# Patient Record
Sex: Female | Born: 1960 | Race: White | Hispanic: No | Marital: Single | Smoking: Never smoker
Health system: Southern US, Community
[De-identification: ages and names within clinical notes are randomized; demographics above are authoritative.]

## PROBLEM LIST (undated history)

## (undated) DIAGNOSIS — K8020 Calculus of gallbladder without cholecystitis without obstruction: Secondary | ICD-10-CM

## (undated) DIAGNOSIS — E079 Disorder of thyroid, unspecified: Secondary | ICD-10-CM

## (undated) DIAGNOSIS — T884XXA Failed or difficult intubation, initial encounter: Secondary | ICD-10-CM

## (undated) DIAGNOSIS — N6019 Diffuse cystic mastopathy of unspecified breast: Secondary | ICD-10-CM

## (undated) DIAGNOSIS — G43909 Migraine, unspecified, not intractable, without status migrainosus: Secondary | ICD-10-CM

## (undated) DIAGNOSIS — I10 Essential (primary) hypertension: Secondary | ICD-10-CM

## (undated) DIAGNOSIS — E669 Obesity, unspecified: Secondary | ICD-10-CM

## (undated) DIAGNOSIS — K760 Fatty (change of) liver, not elsewhere classified: Secondary | ICD-10-CM

## (undated) DIAGNOSIS — H9192 Unspecified hearing loss, left ear: Secondary | ICD-10-CM

## (undated) DIAGNOSIS — E559 Vitamin D deficiency, unspecified: Secondary | ICD-10-CM

## (undated) DIAGNOSIS — H8109 Meniere's disease, unspecified ear: Secondary | ICD-10-CM

## (undated) HISTORY — DX: Obesity, unspecified: E66.9

## (undated) HISTORY — DX: Migraine, unspecified, not intractable, without status migrainosus: G43.909

## (undated) HISTORY — DX: Calculus of gallbladder without cholecystitis without obstruction: K80.20

## (undated) HISTORY — DX: Vitamin D deficiency, unspecified: E55.9

## (undated) HISTORY — DX: Diffuse cystic mastopathy of unspecified breast: N60.19

## (undated) HISTORY — DX: Fatty (change of) liver, not elsewhere classified: K76.0

## (undated) HISTORY — PX: THYROIDECTOMY: SHX17

## (undated) HISTORY — PX: TONSILLECTOMY: SUR1361

## (undated) HISTORY — DX: Unspecified hearing loss, left ear: H91.92

---

## 1999-12-22 ENCOUNTER — Emergency Department (HOSPITAL_COMMUNITY): Admission: EM | Admit: 1999-12-22 | Discharge: 1999-12-22 | Payer: Self-pay | Admitting: Emergency Medicine

## 2003-06-08 ENCOUNTER — Other Ambulatory Visit: Admission: RE | Admit: 2003-06-08 | Discharge: 2003-06-08 | Payer: Self-pay | Admitting: Family Medicine

## 2004-07-03 ENCOUNTER — Other Ambulatory Visit: Admission: RE | Admit: 2004-07-03 | Discharge: 2004-07-03 | Payer: Self-pay | Admitting: Family Medicine

## 2005-07-04 ENCOUNTER — Other Ambulatory Visit: Admission: RE | Admit: 2005-07-04 | Discharge: 2005-07-04 | Payer: Self-pay | Admitting: Family Medicine

## 2005-08-08 ENCOUNTER — Other Ambulatory Visit: Admission: RE | Admit: 2005-08-08 | Discharge: 2005-08-08 | Payer: Self-pay | Admitting: Diagnostic Radiology

## 2005-11-01 ENCOUNTER — Ambulatory Visit: Payer: Self-pay | Admitting: Cardiology

## 2005-11-01 ENCOUNTER — Ambulatory Visit: Payer: Self-pay

## 2005-11-05 ENCOUNTER — Encounter (INDEPENDENT_AMBULATORY_CARE_PROVIDER_SITE_OTHER): Payer: Self-pay | Admitting: Specialist

## 2005-11-05 ENCOUNTER — Ambulatory Visit (HOSPITAL_COMMUNITY): Admission: RE | Admit: 2005-11-05 | Discharge: 2005-11-06 | Payer: Self-pay | Admitting: General Surgery

## 2005-12-13 ENCOUNTER — Ambulatory Visit: Payer: Self-pay | Admitting: Cardiology

## 2006-09-13 ENCOUNTER — Other Ambulatory Visit: Admission: RE | Admit: 2006-09-13 | Discharge: 2006-09-13 | Payer: Self-pay | Admitting: Family Medicine

## 2007-09-19 ENCOUNTER — Other Ambulatory Visit: Admission: RE | Admit: 2007-09-19 | Discharge: 2007-09-19 | Payer: Self-pay | Admitting: Family Medicine

## 2008-01-23 ENCOUNTER — Encounter: Admission: RE | Admit: 2008-01-23 | Discharge: 2008-01-23 | Payer: Self-pay | Admitting: Family Medicine

## 2008-07-17 ENCOUNTER — Emergency Department (HOSPITAL_BASED_OUTPATIENT_CLINIC_OR_DEPARTMENT_OTHER): Admission: EM | Admit: 2008-07-17 | Discharge: 2008-07-18 | Payer: Self-pay | Admitting: Emergency Medicine

## 2008-09-20 ENCOUNTER — Other Ambulatory Visit: Admission: RE | Admit: 2008-09-20 | Discharge: 2008-09-20 | Payer: Self-pay | Admitting: Family Medicine

## 2009-02-03 ENCOUNTER — Encounter: Admission: RE | Admit: 2009-02-03 | Discharge: 2009-02-03 | Payer: Self-pay | Admitting: Family Medicine

## 2009-02-15 ENCOUNTER — Encounter: Admission: RE | Admit: 2009-02-15 | Discharge: 2009-02-15 | Payer: Self-pay | Admitting: Family Medicine

## 2010-06-14 ENCOUNTER — Other Ambulatory Visit: Payer: Self-pay | Admitting: Family Medicine

## 2010-06-14 DIAGNOSIS — Z1231 Encounter for screening mammogram for malignant neoplasm of breast: Secondary | ICD-10-CM

## 2010-06-27 ENCOUNTER — Ambulatory Visit
Admission: RE | Admit: 2010-06-27 | Discharge: 2010-06-27 | Disposition: A | Discharge status: Home / Self Care | Payer: BC Managed Care – PPO | Source: Ambulatory Visit | Attending: Family Medicine | Admitting: Family Medicine

## 2010-06-27 DIAGNOSIS — Z1231 Encounter for screening mammogram for malignant neoplasm of breast: Secondary | ICD-10-CM

## 2010-08-11 NOTE — Assessment & Plan Note (Signed)
[facility]                              CARDIOLOGY OFFICE NOTE   AMAAL, DIMARTINO                      MRN:          [id number]  DATE:12/13/2005                            DOB:          02-Oct-1960    PRIMARY CARE PHYSICIAN:  Dr. Laurann Montana   SURGEON:  Dr. Chevis Pretty III.   REASON FOR VISIT:  Post operative followup.   HISTORY OF PRESENT ILLNESS:  I saw Ms. Brandy Harris back in August for a preop  evaluation prior to an elective thyroidectomy.  Her history is outlined in  my previous note.  I referred her for a resting echocardiogram which  demonstrated vigorous left ventricular systolic function without regional  wall motion abnormalities.  She was noted to be tachycardiac at that time,  possibly related to her thyroid status, although her actual TSH and T4  levels were not particularly abnormal from laboratory data back in June.  We  placed her on a perioperative beta blocker, as outlined below, and she  tolerated this well.  She underwent surgery without cardiovascular  complication and comes in today stating that she feels well.  Her heart rate  has decreased to just under 100 beats per minute by my exam today.  She is  not reporting any chest pain or dyspnea.   ALLERGIES:  No known drug allergies.   PRESENT MEDICATIONS:  1. Allegra D p.r.n.  2. Rhinocort.  3. Singulair.  4. Astelin.  5. Aldean Ast.  6. Hydrochlorothiazide/triamterene 25/37.5 mg p.o. daily.  7. Synthroid 0.1 mg p.o. daily.  8. Toprol XL 25 mg p.o. daily.   REVIEW OF SYSTEMS:  As per history of present illness.   EXAMINATION:  Blood pressure 139/90.  Heart rate 98 and regular.  Weight is  232 pounds.  Patient is comfortable and in no acute distress.  LUNGS:  Clear without labored breathing.  CARDIAC:  Reveals a regular rate and rhythm without pericardial rub, S3  gallop or loud murmur.  EXTREMITIES:  Exhibit no pitting edema.   RECOMMENDATIONS:  1. Sinus  tachycardia, improved following thyroidectomy and also on beta      blocker therapy.  Left ventricular systolic function was vigorous by      preoperative echocardiogram, and she is not reporting any chest pain or      limiting dyspnea, stating that she feels quite well.  At this point I      have asked her to continue her present Toprol XL for the next week and      then decrease the dose to 12.5 mg daily, subsequently discontinuing the      medication after it runs out.  I have then asked for her to follow up      with her primary care physician for further assessment.  At this point,      I do not anticipate any additional cardiac studies.  2. Ms. Mckiver also states that she has followup with Dr. Carolynne Edouard and I will      arrange to have records sent to his office as well.  Jonelle Sidle, MD    SGM/MedQ  DD:  12/13/2005  DT:  12/14/2005  Job #:  [id number]   cc:   Stacie Acres. Cliffton Asters, M.D.  Ollen Gross. Vernell Morgans, M.D.

## 2010-08-11 NOTE — Op Note (Signed)
NAMEKATORI, WIRSING             ACCOUNT NO.:  [id number]   MEDICAL RECORD NO.:  [id number]          PATIENT TYPE:  OIB   LOCATION:  5727                         FACILITY:  [facility]   PHYSICIAN:  Ollen Gross. Vernell Morgans, M.D. DATE OF BIRTH:  [birth year]   DATE OF PROCEDURE:  11/07/2005  DATE OF DISCHARGE:  11/06/2005                                 OPERATIVE REPORT   PREOPERATIVE DIAGNOSIS:  Bilateral thyroid nodules.   POSTOPERATIVE DIAGNOSIS:  Bilateral thyroid nodules.   PROCEDURE:  Total thyroidectomy.   SURGEON:  Ollen Gross. Carolynne Edouard, M.D.   ASSISTANT:  Currie Paris, M.D.   ANESTHESIA:  General endotracheal.   PROCEDURE:  After informed consent was obtained, the patient was brought to  the operating room and placed in supine position on the operating room  table.  After adequate induction of general anesthesia, a roll was placed  behind the patient's shoulders to extend the neck slightly.  The patient's  neck was then prepped with Betadine and draped in usual sterile manner.  A  low transverse collar type incision was made with a 15 blade knife.  This  incision was carried down through the skin and subcutaneous tissue and  platysma sharply with the electrocautery.  The platysma layer was then  grasped with two Allis clamps, both superiorly and inferiorly and  subplatysmal flaps were made.  This was done by a combination of sharp  dissection with electrocautery and blunt finger dissection.  Once this was  accomplished, a Adult nurse was deployed.  The dissection was then  carried down along the midline until the trachea and isthmus of the thyroid  was identified.  Attention was first turned to the left side which had and  indeterminate follicular lesion.  The strap muscles were retracted laterally  with Army-Navy retractor.  Blunt dissection with a Kitner was used to  separate the muscular layers from the anterior surface of the thyroid lobe.  The thyroid lobe was then  rotated medially and anteriorly.  As this was  done, some of the loose areolar tissue was divided sharply with the  electrocautery.  Several small vessels were identified on the lateral  surface of the thyroid gland.  These were controlled with small vascular  clips and divided with Metzenbaum scissors.  Once the gland was able to be  rolled more medially, the parathyroid glands and recurrent laryngeal nerve  were identified and care was taken to keep these structures away from the  dissection.  The dissection was carried out on the surface of the thyroid  gland.  The superior pole of the thyroid gland was then dissected bluntly  circumferentially with a right-angle clamp.  The superior pole was  controlled with 2-0 silk ties and a medium vascular clip and then divided  between the two.  Once the thyroid had been rolled more medial and away from  the recurrent laryngeal nerve and parathyroid glands, the rest of the  thyroid gland was dissected off the trachea by sharp dissection with  electrocautery.  The isthmus was clamped with hemostats and divided with  Metzenbaum scissors.  The left lobe was then sent to pathology for frozen  section.  Ray-Tec gauze was then packed in the operative bed.  The isthmus  was controlled with two 3-0 Vicryl stitches.  At this point, the right lobe  of the thyroid was palpated and had an obvious nodular feeling portion to  it.  Because of this abnormality, it was decided to remove the right lobe as  well.  Again, the strap muscles were retracted laterally with an Army-Navy  retractor.  Finger dissection and blunt dissection with a Kitner were used  to separate the muscles from the anterior surface of the thyroid gland.  The  right thyroid lobe was then mobilized medially and anteriorly.  The loose  areolar tissue was divided sharply with the electrocautery.  Several small  vessels at the lateral edge of the thyroid were controlled with small  vascular clips  and divided with Metzenbaum scissors.  This allowed the  thyroid lobe to be rolled more medially and anteriorly.  The area  parathyroid glands and recurrent laryngeal nerve were also identified and  care was taken to keep the dissection away from these structures.  Once the  thyroid had been bluntly dissected away from those structures, the thyroid  was then separated from the surface of the trachea by sharp dissection with  the electrocautery.  The right lobe was then removed and sent to pathology  for further evaluation.  The operative bed on both sides of the neck were  then inspected and were found to be hemostatic.  The area was irrigated with  copious amounts of saline.  Small postage stamps of Surgicel were placed at  both operative beds.  The strap muscles were then reapproximated along the  midline with interrupted 3-0 Vicryl figure-of-eight stitches.  The platysma  was reapproximated with interrupted 3-0 Vicryl stitches and the skin was  closed with a running 4-0 Monocryl subcuticular stitch.  Benzoin, Steri-  Strips and sterile dressings were applied.  The patient tolerated the  procedure well. At the end of the case, all sponge, needle and instrument  counts were correct.  The patient was then awakened and taken to the  recovery room in stable condition.      Ollen Gross. Vernell Morgans, M.D.  Electronically Signed     PST/MEDQ  D:  11/07/2005  T:  11/07/2005  Job:  [id number]

## 2010-08-11 NOTE — Assessment & Plan Note (Signed)
[facility]                              CARDIOLOGY OFFICE NOTE   YENG, FRANKIE                      MRN:          [id number]  DATE:11/01/2005                            DOB:          01/04/1961    REFERRING PHYSICIAN:  Ollen Gross. Vernell Morgans, MD   REASON FOR CONSULTATION:  Preoperative evaluation.   HISTORY OF PRESENT ILLNESS:  Brandy Harris is referred today for a  preoperative assessment, having had a recent electrocardiogram that was  interpreted as abnormal.  My understanding is that she has been diagnosed  with thyroid nodules and there is concern for potential carcinoma.  She is  scheduled for a thyroidectomy on Monday.  She reports no significant history  of type 2 diabetes mellitus, hypertension, hyperlipidemia or cardiovascular  disease.  She also denies any specific history of cardiomyopathy or  dysrhythmias.  She had a preoperative electrocardiogram obtained on August  7, which was interpreted as reflecting a possible old inferior wall  infarction and also showed sinus tachycardia at 125 beats per minute.  Repeat tracing today shows a Q-wave in lead 3 only and decreased R-wave  progression, but no clear evidence of infarct.  It is certainly possible  that the isolated Q in lead 3 is a normal variant.  Ms. Talwar denies any  problems with exertional chest pain, dyspnea on exertion, orthopnea, PND or  palpitations.  I asked her about her rapid heart rate and she says that she  does not sense a feeling of rapid heart rate, only that she is somewhat  anxious about her visit today.  In going back through her records, I see her  heart rate on evaluation by Dr. Carolynne Edouard on June 5, was 98 beats per minute.  Thyroid studies from June were fairly normal, showing a TSH of 1.095 and a  free T4 of 1.2.  More recent laboratory data shows a normal white blood cell  count of 9.7, normal hemoglobin of 15, normal potassium of 4.1, normal  sodium of 1.38 and  normal BUN and creatinine of 11 and 0.8, respectively.   I reviewed the electrocardiograms with the patient today and discussed  additional evaluation with her.   ALLERGIES:  No known drug allergies.   PRESENT MEDICATIONS INCLUDE:  Allegra D, Rhinocort, Singulair, Astelin,  Lutera, triamterene hydrochlorothiazide, and meclizine.   PAST MEDICAL HISTORY:  As outlined above.  There is additional history of  Meniere's disease.  She has had tonsillectomy at age 50 and previously some  type of ear surgery.   SOCIAL HISTORY:  The patient is single and has no children.  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  She states that she does  not exercise regularly, but with her activities of daily living, including  housework and yard work, she has no limiting symptoms.  She denies any  tobacco or illicit substance use.  She drinks alcohol only occasionally.  She does not drink caffeinated beverages.   REVIEW OF SYSTEMS:  As outlined in the History of Present Illness.  She has  had no fevers or chills, cough, hemoptysis, melena, hematochezia, peripheral  edema, claudication, abdominal pain.   FAMILY HISTORY:  Significant for premature cardiovascular disease.  The  patient states that her father had a myocardial infarction at age 3.   EXAMINATION:  VITAL SIGNS:  Weight is 227 pounds, blood pressure 126/82,  heart rate 122.  GENERAL:  The patient is comfortable, denying any active symptoms.  HEENT:  No exophthalmos noted.  Oropharynx is clear.  NECK:  The neck is supple.  There is thyroid fullness noted bilaterally.  No  tenderness to palpation.  No elevated jugular venous pressure or loud  bruits.  LUNGS:  Clear without labored breathing.  CARDIAC EXAM:  Reveals a regular rate and rhythm without pericardial rub or  S3 gallop.  There is no loud murmur beyond a probable flow murmur, heard at   the base.  ABDOMEN:  Soft with no hepatomegaly or bruits.  EXTREMITIES:  Exhibit no significant pitting edema.  Pulses are 2+.   IMPRESSION AND RECOMMENDATIONS:  1. Sinus tachycardia noted in the setting of apparent thyroid disease.      Interestingly, her TSH and T4 were within normal limits, back in June.      She does not report any sense of palpitations and has no limiting chest      pain or dyspnea.  The isolated Q in lead 3 is most likely a normal      variant and not reflective of an infarct.  She does have poor R-wave      progression anteriorly, although this may also be due to lead      placement.  I have recommended that she begin taking Toprol XL at 25 mg      daily, particularly in the perioperative setting, and we will proceed      with a 2D echocardiogram to define her cardiac structure and function.      If she has a normal ejection fraction with no major valvular disease, I      would anticipate that she should be able to proceed with her planned      thyroidectomy on Monday.  We can then see her back postoperatively in      followup.  Otherwise, if any significant abnormalities are uncovered,      we will evaluate her prior to surgery.  2. Further plans to follow.                                Jonelle Sidle, MD    SGM/MedQ  DD:  11/01/2005  DT:  11/01/2005  Job #:  [id number]   cc:   Ollen Gross. Vernell Morgans, MD  Stacie Acres Cliffton Asters, MD

## 2011-11-07 ENCOUNTER — Encounter (HOSPITAL_BASED_OUTPATIENT_CLINIC_OR_DEPARTMENT_OTHER): Payer: Self-pay | Admitting: Emergency Medicine

## 2011-11-07 ENCOUNTER — Emergency Department (HOSPITAL_BASED_OUTPATIENT_CLINIC_OR_DEPARTMENT_OTHER)
Admission: EM | Admit: 2011-11-07 | Discharge: 2011-11-08 | Disposition: A | Discharge status: Home / Self Care | Payer: BC Managed Care – PPO | Attending: Emergency Medicine | Admitting: Emergency Medicine

## 2011-11-07 DIAGNOSIS — I10 Essential (primary) hypertension: Secondary | ICD-10-CM | POA: Insufficient documentation

## 2011-11-07 DIAGNOSIS — R109 Unspecified abdominal pain: Secondary | ICD-10-CM | POA: Insufficient documentation

## 2011-11-07 DIAGNOSIS — H8109 Meniere's disease, unspecified ear: Secondary | ICD-10-CM | POA: Insufficient documentation

## 2011-11-07 DIAGNOSIS — K2970 Gastritis, unspecified, without bleeding: Secondary | ICD-10-CM

## 2011-11-07 HISTORY — DX: Meniere's disease, unspecified ear: H81.09

## 2011-11-07 HISTORY — DX: Essential (primary) hypertension: I10

## 2011-11-07 LAB — URINALYSIS, ROUTINE W REFLEX MICROSCOPIC
Nitrite: NEGATIVE
Protein, ur: NEGATIVE mg/dL
Urobilinogen, UA: 0.2 mg/dL (ref 0.0–1.0)

## 2011-11-07 LAB — URINE MICROSCOPIC-ADD ON

## 2011-11-07 MED ORDER — GI COCKTAIL ~~LOC~~
30.0000 mL | Freq: Once | ORAL | Status: AC
  Administered 2011-11-07: 30 mL via ORAL
  Filled 2011-11-07: qty 30

## 2011-11-07 NOTE — ED Notes (Addendum)
Pt thought she was getting heartburn and took a Prilosec but got no relief.  5/10 bil upper abd pain, nontender to touch.  Also low back pain.  Skin is warm and moist.  Pt was not able to obtain urine specimen in triage.

## 2011-11-07 NOTE — ED Provider Notes (Signed)
History     CSN: [id number]  Arrival date & time 11/07/11  2124   First MD Initiated Contact with Patient 11/07/11 2318      Chief Complaint  Patient presents with  . Abdominal Pain    (Consider location/radiation/quality/duration/timing/severity/associated sxs/prior treatment) HPI This is a 51 year old white female with about a 4 hour history of epigastric pain. She describes the pain is like indigestion or heartburn, similar to previous episodes of heartburn and more intense. It is associated with nausea and low back pain, which is common when she has episodes of heartburn. She's not had any diarrhea or vomiting. She denies fever or chills. The pain is somewhat improved by lying on her side. It is worse lying on her back. She took Prilosec without relief. There is no change in pain with palpation.  Past Medical History  Diagnosis Date  . Meniere disease   . Hypertension     Past Surgical History  Procedure Date  . Thyroidectomy   . Tonsillectomy     No family history on file.  History  Substance Use Topics  . Smoking status: Not on file  . Smokeless tobacco: Not on file  . Alcohol Use: Yes     occasionally    OB History    Grav Para Term Preterm Abortions TAB SAB Ect Mult Living                  Review of Systems  All other systems reviewed and are negative.    Allergies  Review of patient's allergies indicates no known allergies.  Home Medications   Current Outpatient Rx  Name Route Sig Dispense Refill  . AZELASTINE HCL 137 MCG/SPRAY NA SOLN Nasal Place 1 spray into the nose 2 (two) times daily. Use in each nostril as directed    . DESLORATADINE-PSEUDOEPHED ER 2.5-120 MG PO TB12 Oral Take 1 tablet by mouth 2 (two) times daily.    Marland Kitchen LEVOTHYROXINE SODIUM 150 MCG PO TABS Oral Take 150 mcg by mouth daily.    Marland Kitchen LISINOPRIL PO Oral Take 1 tablet by mouth daily. Patient doesn't know the milligrams of this medication.    . MOMETASONE FUROATE 50 MCG/ACT NA SUSP  Nasal Place 2 sprays into the nose daily.    Marland Kitchen OMEPRAZOLE 10 MG PO CPDR Oral Take 10 mg by mouth daily.    Marland Kitchen VITAMIN D (ERGOCALCIFEROL) 50000 UNITS PO CAPS Oral Take 50,000 Units by mouth every 7 (seven) days.      BP 136/82  Pulse 90  Temp 97.5 F (36.4 C) (Oral)  Resp 20  Ht 5' 7.5" (1.715 m)  Wt 274 lb (124.286 kg)  BMI 42.28 kg/m2  SpO2 100%  LMP 10/07/2011  Physical Exam General: Well-developed, well-nourished female in no acute distress; appearance consistent with age of record HENT: normocephalic, atraumatic Eyes: pupils equal round and reactive to light; extraocular muscles intact Neck: supple Heart: regular rate and rhythm; no murmurs, rubs or gallops Lungs: clear to auscultation bilaterally Abdomen: soft; nondistended; nontender; no masses or hepatosplenomegaly; bowel sounds present Extremities: No deformity; full range of motion; pulses normal Neurologic: Awake, alert and oriented; motor function intact in all extremities and symmetric; no facial droop Skin: Warm and dry Psychiatric: Normal mood and affect    ED Course  Procedures (including critical care time)     MDM   Nursing notes and vitals signs, including pulse oximetry, reviewed.  Summary of this visit's results, reviewed by myself:  Labs:  Results for  orders placed during the hospital encounter of 11/07/11  URINALYSIS, ROUTINE W REFLEX MICROSCOPIC      Component Value Range   Color, Urine YELLOW  YELLOW   APPearance CLOUDY (*) CLEAR   Specific Gravity, Urine 1.027  1.005 - 1.030   pH 5.5  5.0 - 8.0   Glucose, UA NEGATIVE  NEGATIVE mg/dL   Hgb urine dipstick SMALL (*) NEGATIVE   Bilirubin Urine NEGATIVE  NEGATIVE   Ketones, ur NEGATIVE  NEGATIVE mg/dL   Protein, ur NEGATIVE  NEGATIVE mg/dL   Urobilinogen, UA 0.2  0.0 - 1.0 mg/dL   Nitrite NEGATIVE  NEGATIVE   Leukocytes, UA NEGATIVE  NEGATIVE  URINE MICROSCOPIC-ADD ON      Component Value Range   Squamous Epithelial / LPF FEW (*) RARE    WBC, UA 3-6  <3 WBC/hpf   RBC / HPF 3-6  <3 RBC/hpf   Bacteria, UA MANY (*) RARE    Imaging Studies: No results found.  EKG Interpretation:  Date & Time: 11/07/2011 9:58 PM  Rate: 83  Rhythm: normal sinus rhythm  QRS Axis: normal  Intervals: normal  ST/T Wave abnormalities: normal  Conduction Disutrbances:none  Narrative Interpretation:   Old EKG Reviewed: Previously sinus tachycardia  12:06 AM Significant relief with GI cocktail. Patient was advised to take Prilosec daily not just as needed. We will also give her Carafate. She was advised to return for worsening or changing symptoms. There is no tenderness on exam making biliary colic or pancreatitis unlikely.           Hanley Seamen, MD 11/08/11 [phone]

## 2011-11-07 NOTE — ED Notes (Signed)
MD at bedside. 

## 2011-11-08 MED ORDER — SUCRALFATE 1 G PO TABS: ORAL_TABLET | ORAL | Status: DC

## 2011-11-08 MED ORDER — SUCRALFATE 1 G PO TABS
1.0000 g | ORAL_TABLET | Freq: Once | ORAL | Status: AC
  Administered 2011-11-08: 1 g via ORAL
  Filled 2011-11-08: qty 1

## 2011-11-08 MED ORDER — OMEPRAZOLE 20 MG PO CPDR: 20.0000 mg | DELAYED_RELEASE_CAPSULE | Freq: Every day | ORAL | Status: DC

## 2011-12-31 ENCOUNTER — Other Ambulatory Visit: Payer: Self-pay | Admitting: Oncology

## 2012-02-01 ENCOUNTER — Other Ambulatory Visit: Payer: Self-pay | Admitting: Family Medicine

## 2012-02-01 DIAGNOSIS — Z1231 Encounter for screening mammogram for malignant neoplasm of breast: Secondary | ICD-10-CM

## 2012-02-04 ENCOUNTER — Ambulatory Visit
Admission: RE | Admit: 2012-02-04 | Discharge: 2012-02-04 | Disposition: A | Discharge status: Home / Self Care | Payer: BC Managed Care – PPO | Source: Ambulatory Visit | Attending: Family Medicine | Admitting: Family Medicine

## 2012-02-04 DIAGNOSIS — Z1231 Encounter for screening mammogram for malignant neoplasm of breast: Secondary | ICD-10-CM

## 2012-04-24 ENCOUNTER — Other Ambulatory Visit: Payer: Self-pay | Admitting: *Deleted

## 2012-07-14 ENCOUNTER — Other Ambulatory Visit (HOSPITAL_COMMUNITY)
Admission: RE | Admit: 2012-07-14 | Discharge: 2012-07-14 | Disposition: A | Discharge status: Home / Self Care | Payer: BC Managed Care – PPO | Source: Ambulatory Visit | Attending: Family Medicine | Admitting: Family Medicine

## 2012-07-14 ENCOUNTER — Other Ambulatory Visit: Payer: Self-pay | Admitting: Family Medicine

## 2012-07-14 DIAGNOSIS — Z0000 Encounter for general adult medical examination without abnormal findings: Secondary | ICD-10-CM | POA: Insufficient documentation

## 2013-01-27 ENCOUNTER — Other Ambulatory Visit: Payer: Self-pay

## 2013-01-27 DIAGNOSIS — Z1231 Encounter for screening mammogram for malignant neoplasm of breast: Secondary | ICD-10-CM

## 2013-03-16 ENCOUNTER — Ambulatory Visit
Admission: RE | Admit: 2013-03-16 | Discharge: 2013-03-16 | Disposition: A | Discharge status: Home / Self Care | Payer: BC Managed Care – PPO | Source: Ambulatory Visit

## 2013-03-16 DIAGNOSIS — Z1231 Encounter for screening mammogram for malignant neoplasm of breast: Secondary | ICD-10-CM

## 2013-07-05 ENCOUNTER — Encounter (HOSPITAL_BASED_OUTPATIENT_CLINIC_OR_DEPARTMENT_OTHER): Payer: Self-pay | Admitting: Emergency Medicine

## 2013-07-05 ENCOUNTER — Observation Stay (HOSPITAL_BASED_OUTPATIENT_CLINIC_OR_DEPARTMENT_OTHER)
Admission: EM | Admit: 2013-07-05 | Discharge: 2013-07-07 | Disposition: A | Discharge status: Home / Self Care | Payer: BC Managed Care – PPO | Attending: Surgery | Admitting: Surgery

## 2013-07-05 DIAGNOSIS — R112 Nausea with vomiting, unspecified: Secondary | ICD-10-CM | POA: Insufficient documentation

## 2013-07-05 DIAGNOSIS — Z79899 Other long term (current) drug therapy: Secondary | ICD-10-CM | POA: Insufficient documentation

## 2013-07-05 DIAGNOSIS — E890 Postprocedural hypothyroidism: Secondary | ICD-10-CM | POA: Insufficient documentation

## 2013-07-05 DIAGNOSIS — E279 Disorder of adrenal gland, unspecified: Secondary | ICD-10-CM | POA: Insufficient documentation

## 2013-07-05 DIAGNOSIS — D72829 Elevated white blood cell count, unspecified: Secondary | ICD-10-CM | POA: Insufficient documentation

## 2013-07-05 DIAGNOSIS — H8109 Meniere's disease, unspecified ear: Secondary | ICD-10-CM | POA: Insufficient documentation

## 2013-07-05 DIAGNOSIS — M545 Low back pain, unspecified: Secondary | ICD-10-CM | POA: Insufficient documentation

## 2013-07-05 DIAGNOSIS — R1013 Epigastric pain: Secondary | ICD-10-CM | POA: Diagnosis present

## 2013-07-05 DIAGNOSIS — E669 Obesity, unspecified: Secondary | ICD-10-CM | POA: Insufficient documentation

## 2013-07-05 DIAGNOSIS — K37 Unspecified appendicitis: Secondary | ICD-10-CM

## 2013-07-05 DIAGNOSIS — K219 Gastro-esophageal reflux disease without esophagitis: Secondary | ICD-10-CM | POA: Insufficient documentation

## 2013-07-05 DIAGNOSIS — N83209 Unspecified ovarian cyst, unspecified side: Secondary | ICD-10-CM | POA: Insufficient documentation

## 2013-07-05 DIAGNOSIS — I10 Essential (primary) hypertension: Secondary | ICD-10-CM | POA: Diagnosis present

## 2013-07-05 DIAGNOSIS — R109 Unspecified abdominal pain: Principal | ICD-10-CM | POA: Insufficient documentation

## 2013-07-05 HISTORY — DX: Failed or difficult intubation, initial encounter: T88.4XXA

## 2013-07-05 MED ORDER — ONDANSETRON 8 MG PO TBDP
8.0000 mg | ORAL_TABLET | Freq: Once | ORAL | Status: AC
  Administered 2013-07-05: 8 mg via ORAL
  Filled 2013-07-05: qty 1

## 2013-07-05 NOTE — ED Notes (Signed)
Pt reports onset of abd pain at 17:30 today admits to nausea but denies emesis or diarrhea. Also denies event or injury to abd

## 2013-07-06 ENCOUNTER — Encounter (HOSPITAL_BASED_OUTPATIENT_CLINIC_OR_DEPARTMENT_OTHER): Payer: Self-pay | Admitting: Emergency Medicine

## 2013-07-06 ENCOUNTER — Emergency Department (HOSPITAL_BASED_OUTPATIENT_CLINIC_OR_DEPARTMENT_OTHER): Payer: BC Managed Care – PPO

## 2013-07-06 ENCOUNTER — Encounter (HOSPITAL_COMMUNITY): Payer: BC Managed Care – PPO | Admitting: Registered Nurse

## 2013-07-06 ENCOUNTER — Observation Stay (HOSPITAL_COMMUNITY): Payer: BC Managed Care – PPO | Admitting: Registered Nurse

## 2013-07-06 ENCOUNTER — Encounter (HOSPITAL_COMMUNITY): Admission: EM | Disposition: A | Discharge status: Home / Self Care | Payer: Self-pay | Source: Home / Self Care

## 2013-07-06 DIAGNOSIS — R1013 Epigastric pain: Secondary | ICD-10-CM

## 2013-07-06 DIAGNOSIS — R933 Abnormal findings on diagnostic imaging of other parts of digestive tract: Secondary | ICD-10-CM

## 2013-07-06 DIAGNOSIS — K37 Unspecified appendicitis: Secondary | ICD-10-CM | POA: Diagnosis present

## 2013-07-06 DIAGNOSIS — H8109 Meniere's disease, unspecified ear: Secondary | ICD-10-CM | POA: Diagnosis present

## 2013-07-06 DIAGNOSIS — I10 Essential (primary) hypertension: Secondary | ICD-10-CM | POA: Diagnosis present

## 2013-07-06 HISTORY — PX: LAPAROSCOPIC APPENDECTOMY: SHX408

## 2013-07-06 LAB — URINALYSIS, ROUTINE W REFLEX MICROSCOPIC
Bilirubin Urine: NEGATIVE
Glucose, UA: NEGATIVE mg/dL
Ketones, ur: 15 mg/dL — AB
LEUKOCYTES UA: NEGATIVE
Nitrite: NEGATIVE
PROTEIN: NEGATIVE mg/dL
SPECIFIC GRAVITY, URINE: 1.022 (ref 1.005–1.030)
UROBILINOGEN UA: 0.2 mg/dL (ref 0.0–1.0)
pH: 5.5 (ref 5.0–8.0)

## 2013-07-06 LAB — COMPREHENSIVE METABOLIC PANEL
ALBUMIN: 3.9 g/dL (ref 3.5–5.2)
ALK PHOS: 92 U/L (ref 39–117)
ALT: 37 U/L — ABNORMAL HIGH (ref 0–35)
AST: 27 U/L (ref 0–37)
BUN: 13 mg/dL (ref 6–23)
CO2: 22 mEq/L (ref 19–32)
Calcium: 9.3 mg/dL (ref 8.4–10.5)
Chloride: 104 mEq/L (ref 96–112)
Creatinine, Ser: 0.8 mg/dL (ref 0.50–1.10)
GFR calc Af Amer: >90 mL/min (ref >90)
GFR calc non Af Amer: 83 mL/min — ABNORMAL LOW (ref >90)
Glucose, Bld: 135 mg/dL — ABNORMAL HIGH (ref 70–99)
POTASSIUM: 4 meq/L (ref 3.7–5.3)
SODIUM: 140 meq/L (ref 137–147)
TOTAL PROTEIN: 7.4 g/dL (ref 6.0–8.3)
Total Bilirubin: 0.4 mg/dL (ref 0.3–1.2)

## 2013-07-06 LAB — CBC WITH DIFFERENTIAL/PLATELET
BASOS ABS: 0 10*3/uL (ref 0.0–0.1)
Basophils Relative: 0 % (ref 0–1)
EOS ABS: 0 10*3/uL (ref 0.0–0.7)
Eosinophils Relative: 0 % (ref 0–5)
HCT: 41.2 % (ref 36.0–46.0)
Hemoglobin: 14 g/dL (ref 12.0–15.0)
Lymphocytes Relative: 6 % — ABNORMAL LOW (ref 12–46)
Lymphs Abs: 1 10*3/uL (ref 0.7–4.0)
MCH: 27.4 pg (ref 26.0–34.0)
MCHC: 34 g/dL (ref 30.0–36.0)
MCV: 80.6 fL (ref 78.0–100.0)
Monocytes Absolute: 0.4 10*3/uL (ref 0.1–1.0)
Monocytes Relative: 2 % — ABNORMAL LOW (ref 3–12)
NEUTROS PCT: 91 % — AB (ref 43–77)
Neutro Abs: 15.4 10*3/uL — ABNORMAL HIGH (ref 1.7–7.7)
PLATELETS: 333 10*3/uL (ref 150–400)
RBC: 5.11 MIL/uL (ref 3.87–5.11)
RDW: 15.7 % — AB (ref 11.5–15.5)
WBC: 16.9 10*3/uL — AB (ref 4.0–10.5)

## 2013-07-06 LAB — SURGICAL PCR SCREEN
MRSA, PCR: NEGATIVE
STAPHYLOCOCCUS AUREUS: POSITIVE — AB

## 2013-07-06 LAB — URINE MICROSCOPIC-ADD ON

## 2013-07-06 LAB — CBC
HCT: 39.2 % (ref 36.0–46.0)
HEMOGLOBIN: 13.2 g/dL (ref 12.0–15.0)
MCH: 26.9 pg (ref 26.0–34.0)
MCHC: 33.7 g/dL (ref 30.0–36.0)
MCV: 80 fL (ref 78.0–100.0)
Platelets: 276 10*3/uL (ref 150–400)
RBC: 4.9 MIL/uL (ref 3.87–5.11)
RDW: 15.3 % (ref 11.5–15.5)
WBC: 17.7 10*3/uL — AB (ref 4.0–10.5)

## 2013-07-06 LAB — LIPASE, BLOOD: Lipase: 24 U/L (ref 11–59)

## 2013-07-06 SURGERY — APPENDECTOMY, LAPAROSCOPIC
Anesthesia: General | Site: Abdomen

## 2013-07-06 MED ORDER — PROPOFOL 10 MG/ML IV BOLUS
INTRAVENOUS | Status: AC
  Filled 2013-07-06: qty 40

## 2013-07-06 MED ORDER — ROCURONIUM BROMIDE 100 MG/10ML IV SOLN
INTRAVENOUS | Status: AC
  Filled 2013-07-06: qty 1

## 2013-07-06 MED ORDER — ONDANSETRON HCL 4 MG/2ML IJ SOLN
4.0000 mg | Freq: Four times a day (QID) | INTRAMUSCULAR | Status: DC | PRN
  Administered 2013-07-06 (×2): 4 mg via INTRAVENOUS

## 2013-07-06 MED ORDER — ACETAMINOPHEN 10 MG/ML IV SOLN
1000.0000 mg | Freq: Once | INTRAVENOUS | Status: DC
  Filled 2013-07-06 (×2): qty 100

## 2013-07-06 MED ORDER — LIDOCAINE HCL (CARDIAC) 20 MG/ML IV SOLN
INTRAVENOUS | Status: AC
  Filled 2013-07-06: qty 5

## 2013-07-06 MED ORDER — MORPHINE SULFATE 2 MG/ML IJ SOLN: 1.0000 mg | INTRAMUSCULAR | Status: DC | PRN

## 2013-07-06 MED ORDER — SUFENTANIL CITRATE 50 MCG/ML IV SOLN
INTRAVENOUS | Status: AC
  Filled 2013-07-06: qty 1

## 2013-07-06 MED ORDER — BUPIVACAINE-EPINEPHRINE PF 0.25-1:200000 % IJ SOLN
INTRAMUSCULAR | Status: AC
  Filled 2013-07-06: qty 30

## 2013-07-06 MED ORDER — LACTATED RINGERS IV SOLN
INTRAVENOUS | Status: DC | PRN
  Administered 2013-07-06 (×2): via INTRAVENOUS

## 2013-07-06 MED ORDER — PHENYLEPHRINE HCL 10 MG/ML IJ SOLN
INTRAMUSCULAR | Status: DC | PRN
  Administered 2013-07-06: 80 ug via INTRAVENOUS

## 2013-07-06 MED ORDER — DICYCLOMINE HCL 10 MG/ML IM SOLN
20.0000 mg | Freq: Once | INTRAMUSCULAR | Status: AC
  Administered 2013-07-06: 20 mg via INTRAMUSCULAR
  Filled 2013-07-06: qty 2

## 2013-07-06 MED ORDER — DEXAMETHASONE SODIUM PHOSPHATE 10 MG/ML IJ SOLN
INTRAMUSCULAR | Status: AC
  Filled 2013-07-06: qty 1

## 2013-07-06 MED ORDER — HYDROMORPHONE HCL PF 1 MG/ML IJ SOLN: 0.2500 mg | INTRAMUSCULAR | Status: DC | PRN

## 2013-07-06 MED ORDER — ACETAMINOPHEN 10 MG/ML IV SOLN
INTRAVENOUS | Status: DC | PRN
  Administered 2013-07-06: 1000 mg via INTRAVENOUS

## 2013-07-06 MED ORDER — MORPHINE SULFATE 2 MG/ML IJ SOLN
2.0000 mg | Freq: Once | INTRAMUSCULAR | Status: AC
  Administered 2013-07-06: 2 mg via INTRAVENOUS
  Filled 2013-07-06: qty 1

## 2013-07-06 MED ORDER — SUFENTANIL CITRATE 50 MCG/ML IV SOLN
INTRAVENOUS | Status: DC | PRN
  Administered 2013-07-06: 5 ug via INTRAVENOUS
  Administered 2013-07-06 (×3): 10 ug via INTRAVENOUS

## 2013-07-06 MED ORDER — GLYCOPYRROLATE 0.2 MG/ML IJ SOLN
INTRAMUSCULAR | Status: DC | PRN
  Administered 2013-07-06: 0.6 mg via INTRAVENOUS

## 2013-07-06 MED ORDER — PROPOFOL 10 MG/ML IV BOLUS
INTRAVENOUS | Status: DC | PRN
  Administered 2013-07-06: 200 mg via INTRAVENOUS
  Administered 2013-07-06: 50 mg via INTRAVENOUS

## 2013-07-06 MED ORDER — KCL-LACTATED RINGERS-D5W 20 MEQ/L IV SOLN
INTRAVENOUS | Status: DC
  Administered 2013-07-06 – 2013-07-07 (×3): via INTRAVENOUS
  Filled 2013-07-06 (×4): qty 1000

## 2013-07-06 MED ORDER — 0.9 % SODIUM CHLORIDE (POUR BTL) OPTIME
TOPICAL | Status: DC | PRN
  Administered 2013-07-06: 1000 mL

## 2013-07-06 MED ORDER — LACTATED RINGERS IR SOLN
Status: DC | PRN
  Administered 2013-07-06: 200 mL

## 2013-07-06 MED ORDER — IOHEXOL 300 MG/ML  SOLN
50.0000 mL | Freq: Once | INTRAMUSCULAR | Status: AC | PRN
  Administered 2013-07-06: 50 mL via ORAL

## 2013-07-06 MED ORDER — ONDANSETRON HCL 4 MG/2ML IJ SOLN
INTRAMUSCULAR | Status: AC
  Filled 2013-07-06: qty 2

## 2013-07-06 MED ORDER — PROMETHAZINE HCL 25 MG/ML IJ SOLN: 6.2500 mg | INTRAMUSCULAR | Status: DC | PRN

## 2013-07-06 MED ORDER — ENOXAPARIN SODIUM 40 MG/0.4ML ~~LOC~~ SOLN
40.0000 mg | SUBCUTANEOUS | Status: DC
Start: 2013-07-07 — End: 2013-07-07
  Administered 2013-07-07: 40 mg via SUBCUTANEOUS
  Filled 2013-07-06 (×2): qty 0.4

## 2013-07-06 MED ORDER — LISINOPRIL 5 MG PO TABS
5.0000 mg | ORAL_TABLET | Freq: Every day | ORAL | Status: DC
  Administered 2013-07-07: 5 mg via ORAL
  Filled 2013-07-06: qty 1

## 2013-07-06 MED ORDER — MORPHINE SULFATE 2 MG/ML IJ SOLN
2.0000 mg | INTRAMUSCULAR | Status: DC | PRN
  Administered 2013-07-06 (×3): 2 mg via INTRAVENOUS
  Filled 2013-07-06: qty 1
  Filled 2013-07-06: qty 2

## 2013-07-06 MED ORDER — CHLORHEXIDINE GLUCONATE CLOTH 2 % EX PADS
6.0000 | MEDICATED_PAD | Freq: Every day | CUTANEOUS | Status: DC
  Administered 2013-07-06: 6 via TOPICAL

## 2013-07-06 MED ORDER — ONDANSETRON HCL 4 MG/2ML IJ SOLN
INTRAMUSCULAR | Status: AC
  Administered 2013-07-06: 4 mg
  Filled 2013-07-06: qty 2

## 2013-07-06 MED ORDER — OXYCODONE-ACETAMINOPHEN 5-325 MG PO TABS
1.0000 | ORAL_TABLET | ORAL | Status: DC | PRN
  Administered 2013-07-07: 1 via ORAL
  Filled 2013-07-06: qty 1

## 2013-07-06 MED ORDER — NEOSTIGMINE METHYLSULFATE 1 MG/ML IJ SOLN
INTRAMUSCULAR | Status: AC
  Filled 2013-07-06: qty 10

## 2013-07-06 MED ORDER — SODIUM CHLORIDE 0.9 % IJ SOLN
INTRAMUSCULAR | Status: AC
  Filled 2013-07-06: qty 10

## 2013-07-06 MED ORDER — MIDAZOLAM HCL 2 MG/2ML IJ SOLN
INTRAMUSCULAR | Status: AC
  Filled 2013-07-06: qty 2

## 2013-07-06 MED ORDER — GLYCOPYRROLATE 0.2 MG/ML IJ SOLN
INTRAMUSCULAR | Status: AC
  Filled 2013-07-06: qty 3

## 2013-07-06 MED ORDER — NEOSTIGMINE METHYLSULFATE 1 MG/ML IJ SOLN
INTRAMUSCULAR | Status: DC | PRN
  Administered 2013-07-06: 4 mg via INTRAVENOUS

## 2013-07-06 MED ORDER — MIDAZOLAM HCL 5 MG/5ML IJ SOLN
INTRAMUSCULAR | Status: DC | PRN
  Administered 2013-07-06: 2 mg via INTRAVENOUS

## 2013-07-06 MED ORDER — BUPIVACAINE-EPINEPHRINE 0.25% -1:200000 IJ SOLN
INTRAMUSCULAR | Status: DC | PRN
  Administered 2013-07-06: 30 mL

## 2013-07-06 MED ORDER — IOHEXOL 300 MG/ML  SOLN
100.0000 mL | Freq: Once | INTRAMUSCULAR | Status: AC | PRN
  Administered 2013-07-06: 100 mL via INTRAVENOUS

## 2013-07-06 MED ORDER — LIDOCAINE HCL (CARDIAC) 20 MG/ML IV SOLN
INTRAVENOUS | Status: DC | PRN
  Administered 2013-07-06: 50 mg via INTRAVENOUS

## 2013-07-06 MED ORDER — ROCURONIUM BROMIDE 100 MG/10ML IV SOLN
INTRAVENOUS | Status: DC | PRN
  Administered 2013-07-06: 40 mg via INTRAVENOUS

## 2013-07-06 MED ORDER — MORPHINE SULFATE 4 MG/ML IJ SOLN
4.0000 mg | Freq: Once | INTRAMUSCULAR | Status: AC
  Administered 2013-07-06: 4 mg via INTRAVENOUS
  Filled 2013-07-06: qty 1

## 2013-07-06 MED ORDER — PIPERACILLIN-TAZOBACTAM 3.375 G IVPB
3.3750 g | INTRAVENOUS | Status: AC
  Administered 2013-07-06: 3.375 g via INTRAVENOUS
  Filled 2013-07-06: qty 50

## 2013-07-06 MED ORDER — LACTATED RINGERS IV SOLN: INTRAVENOUS | Status: DC

## 2013-07-06 MED ORDER — SUCCINYLCHOLINE CHLORIDE 20 MG/ML IJ SOLN
INTRAMUSCULAR | Status: DC | PRN
  Administered 2013-07-06: 100 mg via INTRAVENOUS

## 2013-07-06 MED ORDER — PHENYLEPHRINE 40 MCG/ML (10ML) SYRINGE FOR IV PUSH (FOR BLOOD PRESSURE SUPPORT)
PREFILLED_SYRINGE | INTRAVENOUS | Status: AC
  Filled 2013-07-06: qty 10

## 2013-07-06 MED ORDER — LEVOTHYROXINE SODIUM 137 MCG PO TABS
137.0000 ug | ORAL_TABLET | Freq: Every day | ORAL | Status: DC
  Administered 2013-07-07: 137 ug via ORAL
  Filled 2013-07-06 (×2): qty 1

## 2013-07-06 MED ORDER — DEXAMETHASONE SODIUM PHOSPHATE 10 MG/ML IJ SOLN
INTRAMUSCULAR | Status: DC | PRN
  Administered 2013-07-06: 10 mg via INTRAVENOUS

## 2013-07-06 SURGICAL SUPPLY — 45 items
ADH SKN CLS APL DERMABOND .7 (GAUZE/BANDAGES/DRESSINGS) ×1
APPLIER CLIP 5 13 M/L LIGAMAX5 (MISCELLANEOUS)
APPLIER CLIP ROT 10 11.4 M/L (STAPLE)
APR CLP MED LRG 11.4X10 (STAPLE)
APR CLP MED LRG 5 ANG JAW (MISCELLANEOUS)
BAG SPEC RTRVL LRG 6X4 10 (ENDOMECHANICALS) ×1
CANISTER SUCTION 2500CC (MISCELLANEOUS) ×3 IMPLANT
CHLORAPREP W/TINT 26ML (MISCELLANEOUS) ×2 IMPLANT
CLIP APPLIE 5 13 M/L LIGAMAX5 (MISCELLANEOUS) IMPLANT
CLIP APPLIE ROT 10 11.4 M/L (STAPLE) IMPLANT
CLOSURE WOUND 1/2 X4 (GAUZE/BANDAGES/DRESSINGS)
CUTTER FLEX LINEAR 45M (STAPLE) ×2 IMPLANT
DECANTER SPIKE VIAL GLASS SM (MISCELLANEOUS) ×1 IMPLANT
DERMABOND ADVANCED (GAUZE/BANDAGES/DRESSINGS) ×2
DERMABOND ADVANCED .7 DNX12 (GAUZE/BANDAGES/DRESSINGS) IMPLANT
DRAPE LAPAROSCOPIC ABDOMINAL (DRAPES) ×3 IMPLANT
DRAPE UTILITY XL STRL (DRAPES) ×1 IMPLANT
ELECT REM PT RETURN 9FT ADLT (ELECTROSURGICAL) ×3
ELECTRODE REM PT RTRN 9FT ADLT (ELECTROSURGICAL) ×1 IMPLANT
GLOVE BIO SURGEON STRL SZ7.5 (GLOVE) ×5 IMPLANT
GLOVE BIOGEL M STRL SZ7.5 (GLOVE) IMPLANT
GLOVE INDICATOR 8.0 STRL GRN (GLOVE) ×1 IMPLANT
GOWN STRL REUS W/TWL LRG LVL3 (GOWN DISPOSABLE) ×1 IMPLANT
GOWN STRL REUS W/TWL XL LVL3 (GOWN DISPOSABLE) ×10 IMPLANT
IV LACTATED RINGERS 1000ML (IV SOLUTION) ×3 IMPLANT
KIT BASIN OR (CUSTOM PROCEDURE TRAY) ×3 IMPLANT
PENCIL BUTTON HOLSTER BLD 10FT (ELECTRODE) ×2 IMPLANT
POUCH SPECIMEN RETRIEVAL 10MM (ENDOMECHANICALS) ×3 IMPLANT
RELOAD 45 VASCULAR/THIN (ENDOMECHANICALS) ×3 IMPLANT
RELOAD STAPLE 45 2.5 WHT GRN (ENDOMECHANICALS) IMPLANT
RELOAD STAPLE 45 3.5 BLU ETS (ENDOMECHANICALS) IMPLANT
RELOAD STAPLE TA45 3.5 REG BLU (ENDOMECHANICALS) IMPLANT
SCALPEL HARMONIC ACE (MISCELLANEOUS) ×3 IMPLANT
SET IRRIG TUBING LAPAROSCOPIC (IRRIGATION / IRRIGATOR) ×3 IMPLANT
SOLUTION ANTI FOG 6CC (MISCELLANEOUS) ×3 IMPLANT
STRIP CLOSURE SKIN 1/2X4 (GAUZE/BANDAGES/DRESSINGS) IMPLANT
SUT MNCRL AB 4-0 PS2 18 (SUTURE) ×3 IMPLANT
TOWEL OR 17X26 10 PK STRL BLUE (TOWEL DISPOSABLE) ×3 IMPLANT
TOWEL OR NON WOVEN STRL DISP B (DISPOSABLE) ×2 IMPLANT
TRAY FOLEY CATH 14FRSI W/METER (CATHETERS) ×3 IMPLANT
TRAY LAP CHOLE (CUSTOM PROCEDURE TRAY) ×3 IMPLANT
TROCAR BLADELESS OPT 5 75 (ENDOMECHANICALS) ×6 IMPLANT
TROCAR XCEL 12X100 BLDLESS (ENDOMECHANICALS) IMPLANT
TROCAR XCEL BLUNT TIP 100MML (ENDOMECHANICALS) ×3 IMPLANT
TUBING INSUFFLATION 10FT LAP (TUBING) ×3 IMPLANT

## 2013-07-06 NOTE — ED Provider Notes (Signed)
Patient informed of adrenal lesion and need for outpatient abdominal MRI to better assess.  Patient verbalizes understanding and agrees to follow up  Felix Meras Alfonso Patten, MD 07/06/13 [phone]

## 2013-07-06 NOTE — Progress Notes (Signed)
Subjective: Pts pain improved with pain meds, but still there.  After getting morphine started vomiting.  Wants surgery if we think theres a good chance its her appendix.  She doesn't want to experience this pain again.  Epigastric abdominal pain has resolved.  Objective: Vital signs in last 24 hours: Temp:  [97.8 F (36.6 C)-98.3 F (36.8 C)] 97.9 F (36.6 C) (04/13 0627) Pulse Rate:  [70-81] 76 (04/13 0627) Resp:  [18-20] 20 (04/13 0627) BP: (117-140)/(64-81) 129/68 mmHg (04/13 0627) SpO2:  [98 %-100 %] 100 % (04/13 0627) Weight:  [241 lb 3.2 oz (109.408 kg)] 241 lb 3.2 oz (109.408 kg) (04/13 0627) Last BM Date: 07/05/13  Intake/Output from previous day:   Intake/Output this shift:    PE: Gen:  Alert, NAD, pleasant Abd: Obese, soft, tender in RLQ to deep palpation, no epigastric abdominal pain, no rebound or guarding, +BS, no HSM, no abdominal scars noted   Lab Results:   Recent Labs  07/06/13 07/06/13 0545  WBC 16.9* 17.7*  HGB 14.0 13.2  HCT 41.2 39.2  PLT 333 276   BMET  Recent Labs  07/06/13  NA 140  K 4.0  CL 104  CO2 22  GLUCOSE 135*  BUN 13  CREATININE 0.80  CALCIUM 9.3   PT/INR No results found for this basename: LABPROT, INR,  in the last 72 hours CMP     Component Value Date/Time   NA 140 07/06/2013 0000   K 4.0 07/06/2013 0000   CL 104 07/06/2013 0000   CO2 22 07/06/2013 0000   GLUCOSE 135* 07/06/2013 0000   BUN 13 07/06/2013 0000   CREATININE 0.80 07/06/2013 0000   CALCIUM 9.3 07/06/2013 0000   PROT 7.4 07/06/2013 0000   ALBUMIN 3.9 07/06/2013 0000   AST 27 07/06/2013 0000   ALT 37* 07/06/2013 0000   ALKPHOS 92 07/06/2013 0000   BILITOT 0.4 07/06/2013 0000   GFRNONAA 83* 07/06/2013 0000   GFRAA >90 07/06/2013 0000   Lipase     Component Value Date/Time   LIPASE 24 07/06/2013 0000       Studies/Results: Ct Abdomen Pelvis W Contrast  07/06/2013   CLINICAL DATA:  Abdominal pain, heartburn for 8 hours  EXAM: CT ABDOMEN AND PELVIS WITH  CONTRAST  TECHNIQUE: Multidetector CT imaging of the abdomen and pelvis was performed using the standard protocol following bolus administration of intravenous contrast.  CONTRAST:  137mL OMNIPAQUE IOHEXOL 300 MG/ML  SOLN  COMPARISON:  None.  FINDINGS: The lung bases are clear.  The liver is diffusely low in attenuation likely secondary to hepatic steatosis. There is no intrahepatic or extrahepatic biliary ductal dilatation. The gallbladder is normal. The spleen demonstrates no focal abnormality.There is a 17 mm right adrenal mass which is incompletely characterized on this exam. The kidneys, left adrenal gland and pancreas are normal. The bladder is unremarkable.  The stomach, duodenum, small intestine, and large intestine demonstrate no contrast extravasation or dilatation. There is diverticulosis without evidence of diverticulitis. The tip of the appendix is mildly dilated measuring 11 mm, with minimal haziness around the tip, but the remainder the appendix is normal. There is no pneumoperitoneum, pneumatosis, or portal venous gas. There is a small fat containing umbilical hernia. There is no abdominal or pelvic free fluid. There is no lymphadenopathy. The uterus and ovaries are unremarkable. There is a small amount of soft tissue air in the posterior right subcutaneous fat which may be iatrogenic secondary to subcu injection.  The abdominal aorta is  normal in caliber.  There are no lytic or sclerotic osseous lesions.  IMPRESSION: 1. The tip of the appendix is mildly dilated with minimal haziness around the tip, but the remainder the appendix is normal. This appearance can be seen with early acute tip appendicitis. Correlate with clinical exam.  2. There is an indeterminate 17 mm right adrenal mass which is incompletely characterized. Recommend further evaluation with an MRI of the abdomen for better characterization.   Electronically Signed   By: Kathreen Devoid   On: 07/06/2013 02:37     Anti-infectives: Anti-infectives   None       Assessment/Plan Back pain radiating to the epigastric region RLQ abdominal pain - with CT findings consistent with tip appendicitis 55mm right adrenal mass Leukocytosis - rising to 17.7  Plan: 1.  Discussed with patient at length about the Pros/cons of surgery vs conservative management.  Her pain and clinical presentation is not typical for appendicitis given that she has lower back & b/l flank pain and epigastric pain.  Her epigastric abdominal pain has resolved.  She does have isolated pain to deep palpation in the RLQ.  She would like to proceed with a diagnostic laparoscopy and appendectomy given the findings on CT scan show possible tip appendicitis.   2.  NPO, IVF, pain control, antiemetics, Zosyn IV    LOS: 1 day    Coralie Keens 07/06/2013, 7:37 AM Pager: [phone]

## 2013-07-06 NOTE — ED Provider Notes (Signed)
CSN: [id number]     Arrival date & time 07/05/13  2300 History  This chart was scribed for Carlo Lorson Alfonso Patten, MD by Marcha Dutton, ED Scribe. This patient was seen in room MH02/MH02 and the patient's care was started at 12:07 AM.    Chief Complaint  Patient presents with  . Abdominal Pain      Patient is a 53 y.o. female presenting with abdominal pain. The history is provided by the patient. No language interpreter was used.  Abdominal Pain Pain location:  Epigastric Pain radiation: lower back. Pain severity:  Severe Onset quality:  Gradual Timing:  Constant Progression:  Unchanged Chronicity:  New Context: not alcohol use and not sick contacts   Relieved by:  Nothing Worsened by:  Nothing tried Associated symptoms: anorexia, nausea and vomiting   Associated symptoms: no constipation, no diarrhea, no dysuria, no vaginal bleeding and no vaginal discharge   Risk factors: has not had multiple surgeries    HPI Comments: Brandy Harris is a 53 y.o. female with a h/o acid reflux and meniers diseasewho presents to the Emergency Department complaining of abdominal pain that began with heart burn 8 hours ago. She reports associated lower back pain and nausea. Pt reports her last BM was an hour ago and that it was a little soft. She states she just finished her LNMP.  Pt denies constipation, hematochezia, vomiting, diarrhea, melena, and constipation. Pt denies h/o abdominal surgery, kidney stones,   Past Medical History  Diagnosis Date  . Meniere disease   . Hypertension    Past Surgical History  Procedure Laterality Date  . Thyroidectomy    . Tonsillectomy     History reviewed. No pertinent family history. History  Substance Use Topics  . Smoking status: Not on file  . Smokeless tobacco: Not on file  . Alcohol Use: Yes     Comment: occasionally   OB History   Grav Para Term Preterm Abortions TAB SAB Ect Mult Living                 Review of Systems   Gastrointestinal: Positive for nausea, vomiting, abdominal pain and anorexia. Negative for diarrhea, constipation and blood in stool.  Genitourinary: Negative for dysuria, vaginal bleeding, vaginal discharge and menstrual problem.  All other systems reviewed and are negative.     Allergies  Sulfa antibiotics  Home Medications   Current Outpatient Rx  Name  Route  Sig  Dispense  Refill  . azelastine (ASTELIN) 137 MCG/SPRAY nasal spray   Nasal   Place 1 spray into the nose 2 (two) times daily. Use in each nostril as directed         . desloratadine-pseudoephedrine (CLARINEX-D 12-HOUR) 2.5-120 MG per tablet   Oral   Take 1 tablet by mouth 2 (two) times daily.         Marland Kitchen levothyroxine (SYNTHROID, LEVOTHROID) 150 MCG tablet   Oral   Take 150 mcg by mouth daily.         Marland Kitchen LISINOPRIL PO   Oral   Take 1 tablet by mouth daily. Patient doesn't know the milligrams of this medication.         . mometasone (NASONEX) 50 MCG/ACT nasal spray   Nasal   Place 2 sprays into the nose daily.         Marland Kitchen omeprazole (PRILOSEC) 10 MG capsule   Oral   Take 10 mg by mouth daily.         Marland Kitchen  EXPIRED: omeprazole (PRILOSEC) 20 MG capsule   Oral   Take 1 capsule (20 mg total) by mouth daily.   30 capsule   0   . sucralfate (CARAFATE) 1 G tablet      Take 1 tablet 3 times daily with meals and at bedtime.   40 tablet   0   . Vitamin D, Ergocalciferol, (DRISDOL) 50000 UNITS CAPS   Oral   Take 50,000 Units by mouth every 7 (seven) days.          Triage Vitals: BP 140/81  Pulse 81  Temp(Src) 97.8 F (36.6 C) (Oral)  Resp 20  SpO2 100%  LMP 06/28/2013  Physical Exam  Nursing note and vitals reviewed. Constitutional: She is oriented to person, place, and time. She appears well-developed and well-nourished. No distress.  HENT:  Head: Normocephalic and atraumatic.  Right Ear: External ear normal.  Left Ear: External ear normal.  Nose: Nose normal.  Mouth/Throat: Oropharynx  is clear and moist. No oropharyngeal exudate.  Eyes: Conjunctivae are normal. Pupils are equal, round, and reactive to light. Right eye exhibits no discharge. Left eye exhibits no discharge. No scleral icterus.  Neck: Normal range of motion. Neck supple. No tracheal deviation present.  Cardiovascular: Normal rate, regular rhythm, normal heart sounds and intact distal pulses.  Exam reveals no gallop and no friction rub.   No murmur heard. Pulmonary/Chest: Effort normal and breath sounds normal. No stridor. No respiratory distress. She has no wheezes. She has no rales.  Abdominal: Soft. She exhibits no distension and no mass. Bowel sounds are increased. There is tenderness in the epigastric area. There is no rebound, no guarding, no tenderness at McBurney's point and negative Murphy's sign.  Hyperactive bowel sounds distribution in right and transverse column  Musculoskeletal: Normal range of motion. She exhibits no edema and no tenderness.  Neurological: She is alert and oriented to person, place, and time. She has normal strength. No cranial nerve deficit (no facial droop, extraocular movements intact, no slurred speech) or sensory deficit. She exhibits normal muscle tone. She displays no seizure activity. Coordination normal.  Skin: Skin is warm and dry. No rash noted.  Psychiatric: She has a normal mood and affect.    ED Course  Procedures (including critical care time)  DIAGNOSTIC STUDIES: Oxygen Saturation is 100% on RA, normal by my interpretation.    COORDINATION OF CARE: 12:12 AM- Pt advised of plan for treatment and pt agrees.    Labs Review Labs Reviewed  URINALYSIS, ROUTINE W REFLEX MICROSCOPIC  CBC WITH DIFFERENTIAL  LIPASE, BLOOD  COMPREHENSIVE METABOLIC PANEL   Imaging Review No results found.   EKG Interpretation None      MDM   Final diagnoses:  None    Case d/w Dr. Barkley Bruns transfer to [facility] ED to be seen by [facility], no abx at this time.    Case d/w Dr.  Venora Maples  Medications  morphine 2 MG/ML injection 2 mg (not administered)  ondansetron (ZOFRAN-ODT) disintegrating tablet 8 mg (8 mg Oral Given 07/05/13 2356)  morphine 4 MG/ML injection 4 mg (4 mg Intravenous Given 07/06/13 0016)  dicyclomine (BENTYL) injection 20 mg (20 mg Intramuscular Given 07/06/13 0016)  iohexol (OMNIPAQUE) 300 MG/ML solution 50 mL (50 mLs Oral Contrast Given 07/06/13 0107)  morphine 2 MG/ML injection 2 mg (2 mg Intravenous Given 07/06/13 0153)  iohexol (OMNIPAQUE) 300 MG/ML solution 100 mL (100 mLs Intravenous Contrast Given 07/06/13 0207)  ondansetron (ZOFRAN) 4 MG/2ML injection (4 mg  Given  07/06/13 0254)     I personally performed the services described in this documentation, which was scribed in my presence. The recorded information has been reviewed and is accurate.     Carlisle Beers, MD 07/06/13 [phone]

## 2013-07-06 NOTE — ED Notes (Signed)
Patient having some dry heaves.  Visitor at bedside.

## 2013-07-06 NOTE — Op Note (Signed)
Brandy Harris [id number] 05-25-1960 07/06/2013  Appendectomy, Lap, Procedure Note  Indications: The patient presented with a history of epigastric, b/l lower back and right-sided abdominal pain. A CT revealed findings consistent with acute appendicitis.  Pre-operative Diagnosis: Abdominal pain, right lower quadrant; appendicitis  Post-operative Diagnosis: Acute appendicitis without mention of peritonitis; ovarian cysts  Surgeon: Gayland Curry   Assistants: none  Anesthesia: General endotracheal anesthesia  ASA Class: 2  Procedure Details  The patient was seen again in the Holding Room. The risks, benefits, complications, treatment options, and expected outcomes were discussed with the patient and/or family. The possibilities of perforation of viscus, bleeding, recurrent infection, the need for additional procedures, failure to diagnose a condition, and creating a complication requiring transfusion or operation were discussed. There was concurrence with the proposed plan and informed consent was obtained. The site of surgery was properly noted. The patient was taken to Operating Room, identified as Brandy Harris and the procedure verified as Appendectomy. A Time Out was held and the above information confirmed.  The patient was placed in the supine position and general anesthesia was induced, along with placement of orogastric tube, SCDs, and a Foley catheter. The abdomen was prepped and draped in a sterile fashion. A 1.5 centimeter infraumbilical incision was made.  The umbilical stalk was elevated, and the midline fascia was incised with a #11 blade.  A Kelly clamp was used to confirm entrance into the peritoneal cavity.  A pursestring suture was passed around the incision with a 0 Vicryl.  A 79mm Hasson was introduced into the abdomen and the tails of the suture were used to hold the Hasson in place.   The pneumoperitoneum was then established to steady pressure of 15 mmHg.  Additional 5  mm cannulas then placed in the left lower quadrant of the abdomen and the suprapubic region under direct visualization. A careful evaluation of the entire abdomen was carried out. The patient was placed in Trendelenburg and left lateral decubitus position. The small intestines were retracted in the cephalad and left lateral direction away from the pelvis and right lower quadrant. The patient was found to have an inflammed thickened appendiceal tip that was extending into the pelvis. There was no evidence of perforation. There was serosanguinous fluid in rt gutter/pelvis.   The appendix was carefully dissected. The appendix was was skeletonized with the harmonic scalpel.   The appendix was divided at its base using an endo-GIA stapler with a white load. No appendiceal stump was left in place. The appendix was removed from the abdomen with an Endocatch bag through the umbilical port.  There was no evidence of bleeding, leakage, or complication after division of the appendix. Irrigation was also performed and irrigate suctioned from the abdomen as well. There was a 2-3 cm left ovarian cyst and a small right ovarian cyst - both appeared simple.   The umbilical port site was closed with the purse string suture. An additional figure of eight suture was placed at the umbilical fascia. The closure was viewed laparoscopically. There was no residual palpable fascial defect.  The trocar site skin wounds were closed with 4-0 Monocryl. Dermabond was applied to the skin incisions.  Instrument, sponge, and needle counts were correct at the conclusion of the case.   Findings: The appendix was found to be inflamed. There were not signs of necrosis.  There was not perforation. There was not abscess formation. L>R ovarian cysts  Estimated Blood Loss:  Minimal  Drains: none         Specimens: appx         Complications:  None; patient tolerated the procedure well.         Disposition: PACU - hemodynamically  stable.         Condition: stable  Brandy Harris. [name], MD, FACS General, Bariatric, & Minimally Invasive Surgery [facility], [place]

## 2013-07-06 NOTE — Anesthesia Preprocedure Evaluation (Addendum)
Anesthesia Evaluation  Patient identified by MRN, date of birth, ID band Patient awake    Reviewed: Allergy & Precautions, H&P , NPO status , Patient's Chart, lab work & pertinent test results  Airway Mallampati: II TM Distance: >3 FB Neck ROM: Full    Dental no notable dental hx.    Pulmonary neg pulmonary ROS,  breath sounds clear to auscultation  Pulmonary exam normal       Cardiovascular Exercise Tolerance: Good hypertension, Pt. on medications Rhythm:Regular Rate:Normal     Neuro/Psych Meniere's disease negative psych ROS   GI/Hepatic Neg liver ROS, GERD-  Medicated,  Endo/Other  negative endocrine ROS  Renal/GU negative Renal ROS  negative genitourinary   Musculoskeletal negative musculoskeletal ROS (+)   Abdominal   Peds negative pediatric ROS (+)  Hematology negative hematology ROS (+)   Anesthesia Other Findings   Reproductive/Obstetrics negative OB ROS                          Anesthesia Physical Anesthesia Plan  ASA: II  Anesthesia Plan: General   Post-op Pain Management:    Induction: Intravenous  Airway Management Planned: Oral ETT  Additional Equipment:   Intra-op Plan:   Post-operative Plan: Extubation in OR  Informed Consent: I have reviewed the patients History and Physical, chart, labs and discussed the procedure including the risks, benefits and alternatives for the proposed anesthesia with the patient or authorized representative who has indicated his/her understanding and acceptance.   Dental advisory given  Plan Discussed with: CRNA  Anesthesia Plan Comments:         Anesthesia Quick Evaluation

## 2013-07-06 NOTE — H&P (Signed)
Brandy Harris is an 53 y.o. female.   Chief Complaint: Severe epigastric abdominal pain HPI: This is a 53 year old female who began having some lower back pain last night at 5:30. The pain then radiated into her epigastric area. The character of the pain was described as aching.  She's had this throughout the years. She usually takes her Prilosec and the pain goes away. This time however the pain persisted and became worse. She presented to [facility] for evaluation.  She was noted to have a leukocytosis and a CT scan was performed. This demonstrated a small adrenal mass and a dilated tip of the appendix measuring 11 mm with minimal haziness around this. The rest of the appendix appeared normal. This was concerning for possible early acute appendicitis. She was subsequently transferred to [facility] for surgical evaluation.  Other than nausea and vomiting, she reports no other associated symptoms.  Past Medical History  Diagnosis Date  . Meniere disease   . Hypertension     Past Surgical History  Procedure Laterality Date  . Thyroidectomy    . Tonsillectomy      History reviewed. No pertinent family history. Social History:  reports that she drinks alcohol. She reports that she does not use illicit drugs. Her tobacco history is not on file.  Allergies:  Allergies  Allergen Reactions  . Sulfa Antibiotics      (Not in a hospital admission)  Results for orders placed during the hospital encounter of 07/05/13 (from the past 48 hour(s))  CBC WITH DIFFERENTIAL     Status: Abnormal   Collection Time    07/06/13 12:00 AM      Result Value Ref Range   WBC 16.9 (*) 4.0 - 10.5 K/uL   RBC 5.11  3.87 - 5.11 MIL/uL   Hemoglobin 14.0  12.0 - 15.0 g/dL   HCT 41.2  36.0 - 46.0 %   MCV 80.6  78.0 - 100.0 fL   MCH 27.4  26.0 - 34.0 pg   MCHC 34.0  30.0 - 36.0 g/dL   RDW 15.7 (*) 11.5 - 15.5 %   Platelets 333  150 - 400 K/uL   Neutrophils Relative % 91 (*) 43 - 77 %    Neutro Abs 15.4 (*) 1.7 - 7.7 K/uL   Lymphocytes Relative 6 (*) 12 - 46 %   Lymphs Abs 1.0  0.7 - 4.0 K/uL   Monocytes Relative 2 (*) 3 - 12 %   Monocytes Absolute 0.4  0.1 - 1.0 K/uL   Eosinophils Relative 0  0 - 5 %   Eosinophils Absolute 0.0  0.0 - 0.7 K/uL   Basophils Relative 0  0 - 1 %   Basophils Absolute 0.0  0.0 - 0.1 K/uL  LIPASE, BLOOD     Status: None   Collection Time    07/06/13 12:00 AM      Result Value Ref Range   Lipase 24  11 - 59 U/L  COMPREHENSIVE METABOLIC PANEL     Status: Abnormal   Collection Time    07/06/13 12:00 AM      Result Value Ref Range   Sodium 140  137 - 147 mEq/L   Potassium 4.0  3.7 - 5.3 mEq/L   Chloride 104  96 - 112 mEq/L   CO2 22  19 - 32 mEq/L   Glucose, Bld 135 (*) 70 - 99 mg/dL   BUN 13  6 - 23 mg/dL   Creatinine, Ser 0.80  0.50 - 1.10 mg/dL   Calcium 9.3  8.4 - 10.5 mg/dL   Total Protein 7.4  6.0 - 8.3 g/dL   Albumin 3.9  3.5 - 5.2 g/dL   AST 27  0 - 37 U/L   ALT 37 (*) 0 - 35 U/L   Alkaline Phosphatase 92  39 - 117 U/L   Total Bilirubin 0.4  0.3 - 1.2 mg/dL   GFR calc non Af Amer 83 (*) >90 mL/min   GFR calc Af Amer >90  >90 mL/min   Comment: (NOTE)     The eGFR has been calculated using the CKD EPI equation.     This calculation has not been validated in all clinical situations.     eGFR's persistently <90 mL/min signify possible Chronic Kidney     Disease.  URINALYSIS, ROUTINE W REFLEX MICROSCOPIC     Status: Abnormal   Collection Time    07/06/13 12:02 AM      Result Value Ref Range   Color, Urine YELLOW  YELLOW   APPearance CLEAR  CLEAR   Specific Gravity, Urine 1.022  1.005 - 1.030   pH 5.5  5.0 - 8.0   Glucose, UA NEGATIVE  NEGATIVE mg/dL   Hgb urine dipstick TRACE (*) NEGATIVE   Bilirubin Urine NEGATIVE  NEGATIVE   Ketones, ur 15 (*) NEGATIVE mg/dL   Protein, ur NEGATIVE  NEGATIVE mg/dL   Urobilinogen, UA 0.2  0.0 - 1.0 mg/dL   Nitrite NEGATIVE  NEGATIVE   Leukocytes, UA NEGATIVE  NEGATIVE  URINE  MICROSCOPIC-ADD ON     Status: None   Collection Time    07/06/13 12:02 AM      Result Value Ref Range   Squamous Epithelial / LPF RARE  RARE   WBC, UA 0-2  <3 WBC/hpf   RBC / HPF 3-6  <3 RBC/hpf   Bacteria, UA RARE  RARE   Urine-Other MUCOUS PRESENT     Ct Abdomen Pelvis W Contrast  07/06/2013   CLINICAL DATA:  Abdominal pain, heartburn for 8 hours  EXAM: CT ABDOMEN AND PELVIS WITH CONTRAST  TECHNIQUE: Multidetector CT imaging of the abdomen and pelvis was performed using the standard protocol following bolus administration of intravenous contrast.  CONTRAST:  157m OMNIPAQUE IOHEXOL 300 MG/ML  SOLN  COMPARISON:  None.  FINDINGS: The lung bases are clear.  The liver is diffusely low in attenuation likely secondary to hepatic steatosis. There is no intrahepatic or extrahepatic biliary ductal dilatation. The gallbladder is normal. The spleen demonstrates no focal abnormality.There is a 17 mm right adrenal mass which is incompletely characterized on this exam. The kidneys, left adrenal gland and pancreas are normal. The bladder is unremarkable.  The stomach, duodenum, small intestine, and large intestine demonstrate no contrast extravasation or dilatation. There is diverticulosis without evidence of diverticulitis. The tip of the appendix is mildly dilated measuring 11 mm, with minimal haziness around the tip, but the remainder the appendix is normal. There is no pneumoperitoneum, pneumatosis, or portal venous gas. There is a small fat containing umbilical hernia. There is no abdominal or pelvic free fluid. There is no lymphadenopathy. The uterus and ovaries are unremarkable. There is a small amount of soft tissue air in the posterior right subcutaneous fat which may be iatrogenic secondary to subcu injection.  The abdominal aorta is normal in caliber.  There are no lytic or sclerotic osseous lesions.  IMPRESSION: 1. The tip of the appendix is mildly dilated with minimal haziness around  the tip, but the  remainder the appendix is normal. This appearance can be seen with early acute tip appendicitis. Correlate with clinical exam.  2. There is an indeterminate 17 mm right adrenal mass which is incompletely characterized. Recommend further evaluation with an MRI of the abdomen for better characterization.   Electronically Signed   By: Kathreen Devoid   On: 07/06/2013 02:37    Review of Systems  Constitutional: Negative for fever and chills.  HENT: Negative for sore throat.   Respiratory: Negative for shortness of breath.   Cardiovascular: Negative for chest pain.  Gastrointestinal: Positive for nausea, vomiting and abdominal pain. Negative for diarrhea.  Genitourinary: Negative for dysuria and hematuria.  Musculoskeletal: Positive for back pain.    Blood pressure 127/69, pulse 70, temperature 97.9 F (36.6 C), temperature source Oral, resp. rate 18, last menstrual period 06/28/2013, SpO2 98.00%. Physical Exam  Constitutional: No distress.  Overweight.  HENT:  Head: Normocephalic and atraumatic.  Eyes: EOM are normal. No scleral icterus.  Neck: Neck supple.  Lower transverse scar  Cardiovascular: Normal rate and regular rhythm.   Respiratory: Effort normal and breath sounds normal.  GI: Soft. She exhibits no distension and no mass. There is tenderness (Mild tenderness in right lower quadrant to deep palpation). There is no guarding.  Obese.  Musculoskeletal: She exhibits no edema.  Neurological: She is alert.  Skin: Skin is warm and dry.  Psychiatric: She has a normal mood and affect. Her behavior is normal.     Assessment/Plan 1. Back pain radiating to the epigastric region.  No subjective lower abdominal pain. No significant upper abdominal pathology noted to explain this on CT scan. Does have what appears to be an abnormal tip of the appendix. This is concerning for early acute appendicitis by CT criteria.  Her history is atypical for acute appendicitis. Does have some mild tenderness  on abdominal exam.  2. 17 mm right adrenal mass.  Plan:  Admit to the hospital. Repeat CT. She may end up undergoing a laparoscopic possible open appendectomy. Will reevaluate her once we see the repeat white blood cell count and reexam her.  I have discussed the procedure and risks of appendectomy. The risks include but are not limited to bleeding, infection, wound problems, anesthesia, injury to intra-abdominal organs, possibility of postoperative ileus. She seems to understand.  Rhunette Croft Athelene Hursey 07/06/2013, 5:22 AM

## 2013-07-06 NOTE — Anesthesia Postprocedure Evaluation (Signed)
  Anesthesia Post-op Note  Patient: Brandy Harris  Procedure(s) Performed: Procedure(s) (LRB): Diagnostic laparoscopy,  laparoscopic appendectomy (N/A)  Patient Location: PACU  Anesthesia Type: General  Level of Consciousness: awake and alert   Airway and Oxygen Therapy: Patient Spontanous Breathing  Post-op Pain: mild  Post-op Assessment: Post-op Vital signs reviewed, Patient's Cardiovascular Status Stable, Respiratory Function Stable, Patent Airway and No signs of Nausea or vomiting  Last Vitals:  Filed Vitals:   07/06/13 1428  BP: 125/70  Pulse: 83  Temp: 36.3 C  Resp:     Post-op Vital Signs: stable   Complications: No apparent anesthesia complications

## 2013-07-06 NOTE — ED Notes (Signed)
Bed: NO03 Expected date:  Expected time:  Means of arrival:  Comments: PT from [place]

## 2013-07-06 NOTE — Interval H&P Note (Signed)
History and Physical Interval Note:  07/06/2013 10:28 AM  Brandy Harris  has presented today for surgery, with the diagnosis of possible appendicitis  The various methods of treatment have been discussed with the patient and family. After consideration of risks, benefits and other options for treatment, the patient has consented to  Procedure(s): Diagnostic laparoscopy, possible laparoscopic appendectomy, possible open appedectomy (N/A) as a surgical intervention .  The patient's history has been reviewed, patient examined, no change in status, stable for surgery.  I have reviewed the patient's chart and labs.  Questions were answered to the patient's satisfaction.    [name]. [name], MD, [place], Bariatric, & Minimally Invasive Surgery [facility], [place]   Gayland Curry

## 2013-07-06 NOTE — ED Notes (Signed)
Report given to B. Rolena Infante, Therapist, sports.

## 2013-07-06 NOTE — Transfer of Care (Signed)
Immediate Anesthesia Transfer of Care Note  Patient: Brandy Harris  Procedure(s) Performed: Procedure(s): Diagnostic laparoscopy,  laparoscopic appendectomy (N/A)  Patient Location: PACU  Anesthesia Type:General  Level of Consciousness: awake, alert , oriented and patient cooperative  Airway & Oxygen Therapy: Patient Spontanous Breathing and Patient connected to face mask oxygen  Post-op Assessment: Report given to PACU RN, Post -op Vital signs reviewed and stable and Patient moving all extremities X 4  Post vital signs: stable  Complications: No apparent anesthesia complications

## 2013-07-06 NOTE — Progress Notes (Signed)
History reviewed. Upper abd pain last night with n/v then went to b/l lower back. No prior symptoms. Some chills.  Pain now in lower back and in RLQ. No dysuria/vag discharge. No melena/hematochezia. No cp/sob/doe/orthopnea/LE swelling  Alert, nad cta  Obese, soft, no peritonitis, tender to deep palpation RLQ  Ct reviewed - only abnormality i could find was tip of appendix dilated and inflamed. appx is in post abd at pelvic inlet which may explain lower back pain.   Given increasing WBC and CT findings, i think safest course is to proceed with dx laparoscopy, appendectomy.   We discussed the etiology and management of acute appendicitis. We discussed operative and nonoperative management.  I recommended operative management along with IV antibiotics.  We discussed laparoscopic appendectomy. We discussed the risk and benefits of surgery including but not limited to bleeding, infection, injury to surrounding structures, need to convert to an open procedure, blood clot formation, post operative abscess or wound infection, staple line complications such as leak or bleeding, hernia formation, post operative ileus, need for additional procedures, anesthesia complications, and the typical postoperative course. I explained that the patient should expect a good improvement in their symptoms.  All of her questions asked and answered.   Brandy Harris. [name], MD, FACS General, Bariatric, & Minimally Invasive Surgery [facility], [place]

## 2013-07-06 NOTE — ED Notes (Signed)
Report called to [place] ED pt has waiting bed

## 2013-07-07 ENCOUNTER — Encounter (HOSPITAL_COMMUNITY): Payer: Self-pay | Admitting: General Surgery

## 2013-07-07 ENCOUNTER — Encounter: Payer: Self-pay | Admitting: General Surgery

## 2013-07-07 MED ORDER — FLUTICASONE PROPIONATE 50 MCG/ACT NA SUSP
1.0000 | Freq: Every day | NASAL | Status: DC
  Administered 2013-07-07: 1 via NASAL
  Filled 2013-07-07: qty 16

## 2013-07-07 MED ORDER — LORATADINE 10 MG PO TABS
10.0000 mg | ORAL_TABLET | Freq: Every day | ORAL | Status: DC
  Administered 2013-07-07: 10 mg via ORAL
  Filled 2013-07-07: qty 1

## 2013-07-07 MED ORDER — AZELASTINE HCL 0.1 % NA SOLN
1.0000 | Freq: Two times a day (BID) | NASAL | Status: DC
  Administered 2013-07-07: 1 via NASAL
  Filled 2013-07-07: qty 30

## 2013-07-07 MED ORDER — PSEUDOEPHEDRINE HCL ER 120 MG PO TB12
120.0000 mg | ORAL_TABLET | Freq: Two times a day (BID) | ORAL | Status: DC
  Administered 2013-07-07: 120 mg via ORAL
  Filled 2013-07-07 (×2): qty 1

## 2013-07-07 MED ORDER — DESLORATADINE-PSEUDOEPHED ER 2.5-120 MG PO TB12: 1.0000 | ORAL_TABLET | Freq: Two times a day (BID) | ORAL | Status: DC

## 2013-07-07 MED ORDER — OXYCODONE-ACETAMINOPHEN 5-325 MG PO TABS: 1.0000 | ORAL_TABLET | ORAL | Status: DC | PRN

## 2013-07-07 MED ORDER — IBUPROFEN 200 MG PO TABS: ORAL_TABLET | ORAL | Status: DC

## 2013-07-07 NOTE — Discharge Summary (Signed)
Earnstine Regal, PA-C Physician Assistant Cosign Needed Surgery Progress Notes Service date: 07/07/2013 2:04 PM  Physician Discharge Summary   Patient ID: Brandy Harris MRN: [id number] DOB/AGE: 1961-01-28 53 y.o.  Admit date: 07/05/2013 Discharge date: 07/07/2013  Admission Diagnoses:  Acute appendicitis without mention of peritonitis; ovarian cysts   Hypertension   Meniere disease   Hx of hypothyroid after thryoidectomy   Discharge Diagnoses:   Principal Problem:   Epigastric abdominal pain Active Problems:   HTN (hypertension)   Meniere disease   Appendicitis   PROCEDURES: S/p Appendectomy, Lap, 07/06/13, Dr. Janalyn Rouse Course: This is a 53 year old female who began having some lower back pain last night at 5:30. The pain then radiated into her epigastric area. The character of the pain was described as aching. She's had this throughout the years. She usually takes her Prilosec and the pain goes away. This time however the pain persisted and became worse. She presented to [facility] for evaluation. She was noted to have a leukocytosis and a CT scan was performed. This demonstrated a small adrenal mass and a dilated tip of the appendix measuring 11 mm with minimal haziness around this. The rest of the appendix appeared normal. This was concerning for possible early acute appendicitis. She was subsequently transferred to [facility] for surgical evaluation. Other than nausea and vomiting, she reports no other associated symptoms. She was admitted by Dr. Zella Richer and after review taken to the OR the following morning by Dr. [name].  She did well and was advanced to a regular diet.  She was ready for discharge the following Am.  We plan to see her back in 2-3 week.  Condition on d/c:  Improved   Disposition: 01-Home or Self Care      Medication List              azelastine 137 MCG/SPRAY nasal spray   Commonly known as:  ASTELIN   Place 1  spray into the nose 2 (two) times daily. Use in each nostril as directed        desloratadine-pseudoephedrine 2.5-120 MG per tablet   Commonly known as:  CLARINEX-D 12-hour   Take 1 tablet by mouth 2 (two) times daily.        ibuprofen 200 MG tablet   Commonly known as:  ADVIL   2-3 tablets every 8 hours as needed for pain.        levothyroxine 137 MCG tablet   Commonly known as:  SYNTHROID, LEVOTHROID   Take 137 mcg by mouth daily before breakfast.        lisinopril 5 MG tablet   Commonly known as:  PRINIVIL,ZESTRIL   Take 5 mg by mouth daily.        mometasone 50 MCG/ACT nasal spray   Commonly known as:  NASONEX   Place 2 sprays into the nose daily.        omeprazole 20 MG capsule   Commonly known as:  PRILOSEC   Take 1 capsule (20 mg total) by mouth daily.        oxyCODONE-acetaminophen 5-325 MG per tablet   Commonly known as:  PERCOCET/ROXICET   Take 1-2 tablets by mouth every 4 (four) hours as needed for moderate pain.        sucralfate 1 G tablet   Commonly known as:  CARAFATE   Take 1 tablet 3 times daily with meals and at bedtime.  VITAMIN D (CHOLECALCIFEROL) PO   Take 1 tablet by mouth daily. OTC                Follow-up Information     Follow up with Gayland Curry, MD. Schedule an appointment as soon as possible for a visit in 2 weeks.     Specialty:  General Surgery     Contact information:     [address]  [place]  [address] [phone]         Signed: Earnstine Regal 07/07/2013, 2:05 PM

## 2013-07-07 NOTE — Progress Notes (Signed)
Physician Discharge Summary  Patient ID: Brandy Harris MRN: [id number] DOB/AGE: 54-Apr-1962 53 y.o.  Admit date: 07/05/2013 Discharge date: 07/07/2013  Admission Diagnoses:  Acute appendicitis without mention of peritonitis; ovarian cysts  Hypertension  Meniere disease  Hx of hypothyroid after thryoidectomy   Discharge Diagnoses:  Principal Problem:   Epigastric abdominal pain Active Problems:   HTN (hypertension)   Meniere disease   Appendicitis   PROCEDURES: S/p Appendectomy, Lap, 07/06/13, Dr. Janalyn Rouse Course: This is a 53 year old female who began having some lower back pain last night at 5:30. The pain then radiated into her epigastric area. The character of the pain was described as aching. She's had this throughout the years. She usually takes her Prilosec and the pain goes away. This time however the pain persisted and became worse. She presented to [facility] for evaluation. She was noted to have a leukocytosis and a CT scan was performed. This demonstrated a small adrenal mass and a dilated tip of the appendix measuring 11 mm with minimal haziness around this. The rest of the appendix appeared normal. This was concerning for possible early acute appendicitis. She was subsequently transferred to [facility] for surgical evaluation. Other than nausea and vomiting, she reports no other associated symptoms. She was admitted by Dr. Zella Richer and after review taken to the OR the following morning by Dr. [name].  She did well and was advanced to a regular diet.  She was ready for discharge the following Am.  We plan to see her back in 2-3 week.  Condition on d/c:  Improved   Disposition: 01-Home or Self Care     Medication List         azelastine 137 MCG/SPRAY nasal spray  Commonly known as:  ASTELIN  Place 1 spray into the nose 2 (two) times daily. Use in each nostril as directed     desloratadine-pseudoephedrine 2.5-120 MG per tablet   Commonly known as:  CLARINEX-D 12-hour  Take 1 tablet by mouth 2 (two) times daily.     ibuprofen 200 MG tablet  Commonly known as:  ADVIL  2-3 tablets every 8 hours as needed for pain.     levothyroxine 137 MCG tablet  Commonly known as:  SYNTHROID, LEVOTHROID  Take 137 mcg by mouth daily before breakfast.     lisinopril 5 MG tablet  Commonly known as:  PRINIVIL,ZESTRIL  Take 5 mg by mouth daily.     mometasone 50 MCG/ACT nasal spray  Commonly known as:  NASONEX  Place 2 sprays into the nose daily.     omeprazole 20 MG capsule  Commonly known as:  PRILOSEC  Take 1 capsule (20 mg total) by mouth daily.     oxyCODONE-acetaminophen 5-325 MG per tablet  Commonly known as:  PERCOCET/ROXICET  Take 1-2 tablets by mouth every 4 (four) hours as needed for moderate pain.     sucralfate 1 G tablet  Commonly known as:  CARAFATE  Take 1 tablet 3 times daily with meals and at bedtime.     VITAMIN D (CHOLECALCIFEROL) PO  Take 1 tablet by mouth daily. OTC           Follow-up Information   Follow up with Gayland Curry, MD. Schedule an appointment as soon as possible for a visit in 2 weeks.   Specialty:  General Surgery   Contact information:   [address] [place] [address] [phone]  Signed: Earnstine Regal 07/07/2013, 2:05 PM

## 2013-07-07 NOTE — Progress Notes (Signed)
1 Day Post-Op  Subjective: Sore, but feels better.  Sites ok, she has some drainage from the main trochar site, I changed it, and it should be fine.  Objective: Vital signs in last 24 hours: Temp:  [97.4 F (36.3 C)-99 F (37.2 C)] 98.3 F (36.8 C) (04/14 0530) Pulse Rate:  [56-84] 77 (04/14 0530) Resp:  [8-20] 18 (04/14 0530) BP: (107-131)/(64-81) 113/71 mmHg (04/14 0530) SpO2:  [95 %-100 %] 96 % (04/14 0530) Last BM Date: 07/05/13 120 PO Regular diet Afebrile, VSS No labs Intake/Output from previous day: 04/13 0701 - 04/14 0700 In: 4270 [P.O.:120; I.V.:4150] Out: -  Intake/Output this shift:    General appearance: alert, cooperative and no distress GI: soft sore, starting breakfast, sites OK  Lab Results:   Recent Labs  07/06/13 07/06/13 0545  WBC 16.9* 17.7*  HGB 14.0 13.2  HCT 41.2 39.2  PLT 333 276    BMET  Recent Labs  07/06/13  NA 140  K 4.0  CL 104  CO2 22  GLUCOSE 135*  BUN 13  CREATININE 0.80  CALCIUM 9.3   PT/INR No results found for this basename: LABPROT, INR,  in the last 72 hours   Recent Labs Lab 07/06/13  AST 27  ALT 37*  ALKPHOS 92  BILITOT 0.4  PROT 7.4  ALBUMIN 3.9     Lipase     Component Value Date/Time   LIPASE 24 07/06/2013 0000     Studies/Results: Ct Abdomen Pelvis W Contrast  07/06/2013   CLINICAL DATA:  Abdominal pain, heartburn for 8 hours  EXAM: CT ABDOMEN AND PELVIS WITH CONTRAST  TECHNIQUE: Multidetector CT imaging of the abdomen and pelvis was performed using the standard protocol following bolus administration of intravenous contrast.  CONTRAST:  143mL OMNIPAQUE IOHEXOL 300 MG/ML  SOLN  COMPARISON:  None.  FINDINGS: The lung bases are clear.  The liver is diffusely low in attenuation likely secondary to hepatic steatosis. There is no intrahepatic or extrahepatic biliary ductal dilatation. The gallbladder is normal. The spleen demonstrates no focal abnormality.There is a 17 mm right adrenal mass which is  incompletely characterized on this exam. The kidneys, left adrenal gland and pancreas are normal. The bladder is unremarkable.  The stomach, duodenum, small intestine, and large intestine demonstrate no contrast extravasation or dilatation. There is diverticulosis without evidence of diverticulitis. The tip of the appendix is mildly dilated measuring 11 mm, with minimal haziness around the tip, but the remainder the appendix is normal. There is no pneumoperitoneum, pneumatosis, or portal venous gas. There is a small fat containing umbilical hernia. There is no abdominal or pelvic free fluid. There is no lymphadenopathy. The uterus and ovaries are unremarkable. There is a small amount of soft tissue air in the posterior right subcutaneous fat which may be iatrogenic secondary to subcu injection.  The abdominal aorta is normal in caliber.  There are no lytic or sclerotic osseous lesions.  IMPRESSION: 1. The tip of the appendix is mildly dilated with minimal haziness around the tip, but the remainder the appendix is normal. This appearance can be seen with early acute tip appendicitis. Correlate with clinical exam.  2. There is an indeterminate 17 mm right adrenal mass which is incompletely characterized. Recommend further evaluation with an MRI of the abdomen for better characterization.   Electronically Signed   By: Kathreen Devoid   On: 07/06/2013 02:37    Medications: . enoxaparin (LOVENOX) injection  40 mg Subcutaneous Q24H  . levothyroxine  137  mcg Oral QAC breakfast  . lisinopril  5 mg Oral Daily   Prior to Admission medications   Medication Sig Start Date End Date Taking? Authorizing Provider  azelastine (ASTELIN) 137 MCG/SPRAY nasal spray Place 1 spray into the nose 2 (two) times daily. Use in each nostril as directed   Yes Historical Provider, MD  desloratadine-pseudoephedrine (CLARINEX-D 12-HOUR) 2.5-120 MG per tablet Take 1 tablet by mouth 2 (two) times daily.   Yes Historical Provider, MD   levothyroxine (SYNTHROID, LEVOTHROID) 137 MCG tablet Take 137 mcg by mouth daily before breakfast.   Yes Historical Provider, MD  lisinopril (PRINIVIL,ZESTRIL) 5 MG tablet Take 5 mg by mouth daily.   Yes Historical Provider, MD  mometasone (NASONEX) 50 MCG/ACT nasal spray Place 2 sprays into the nose daily.   Yes Historical Provider, MD  omeprazole (PRILOSEC) 10 MG capsule Take 10 mg by mouth daily.   Yes Historical Provider, MD  VITAMIN D, CHOLECALCIFEROL, PO Take 1 tablet by mouth daily. OTC   Yes Historical Provider, MD  omeprazole (PRILOSEC) 20 MG capsule Take 1 capsule (20 mg total) by mouth daily. 11/08/11 11/07/12  Karen Chafe Molpus, MD  sucralfate (CARAFATE) 1 G tablet Take 1 tablet 3 times daily with meals and at bedtime. 11/08/11   Karen Chafe Molpus, MD     Assessment/Plan Acute appendicitis without mention of peritonitis; ovarian cysts S/p Appendectomy, Lap, 07/06/13, Dr. Greer Pickerel Hypertension Meniere disease Hx of hypothyroid after thryoidectomy  Plan:  Mobilize, and if she is OK she wants to go home after lunch.      LOS: 2 days    Earnstine Regal 07/07/2013

## 2013-07-07 NOTE — Progress Notes (Signed)
Misako Roeder M. Jeramy Dimmick, MD, FACS General, Bariatric, & Minimally Invasive Surgery [facility]  

## 2013-07-07 NOTE — Progress Notes (Signed)
Looks good Tolerating clear liquid diet Abdomen-obese, soft, mild incisional pain  If tolerates lunch okay to discharge Discussed discharge instructions the patient  [name]. [name], MD, FACS General, Bariatric, & Minimally Invasive Surgery [facility], [place]

## 2013-07-07 NOTE — Discharge Instructions (Signed)
Laparoscopic Appendectomy Care After Refer to this sheet in the next few weeks. These instructions provide you with information on caring for yourself after your procedure. Your caregiver may also give you more specific instructions. Your treatment has been planned according to current medical practices, but problems sometimes occur. Call your caregiver if you have any problems or questions after your procedure. HOME CARE INSTRUCTIONS  Do not drive while taking narcotic pain medicines.  Use stool softener if you become constipated from your pain medicines.  Change your bandages (dressings) as directed.  Keep your wounds clean and dry. You may wash the wounds gently with soap and water. Gently pat the wounds dry with a clean towel.  Do not take baths, swim, or use hot tubs for 10 days, or as instructed by your caregiver.  Only take over-the-counter or prescription medicines for pain, discomfort, or fever as directed by your caregiver.  You may continue your normal diet as directed.  Do not lift more than 10 pounds (4.5 kg) or play contact sports for 3 weeks, or as directed.  Slowly increase your activity after surgery.  Take deep breaths to avoid getting a lung infection (pneumonia). SEEK MEDICAL CARE IF:  You have redness, swelling, or increasing pain in your wounds.  You have pus coming from your wounds.  You have drainage from a wound that lasts longer than 1 day.  You notice a bad smell coming from the wounds or dressing.  Your wound edges break open after stitches (sutures) have been removed.  You notice increasing pain in the shoulders (shoulder strap areas) or near your shoulder blades.  You develop dizzy episodes or fainting while standing.  You develop shortness of breath.  You develop persistent nausea or vomiting.  You cannot control your bowel functions or lose your appetite.  You develop diarrhea. SEEK IMMEDIATE MEDICAL CARE IF:   You have a fever.  You  develop a rash.  You have difficulty breathing or sharp pains in your chest.  You develop any reaction or side effects to medicines given. MAKE SURE YOU:  Understand these instructions.  Will watch your condition.  Will get help right away if you are not doing well or get worse. Document Released: 03/12/2005 Document Revised: 06/04/2011 Document Reviewed: 09/19/2010 [facility] Patient Information 2014 [place], [place].  CCS ______[facility] LAPAROSCOPIC SURGERY: POST OP INSTRUCTIONS Always review your discharge instruction sheet given to you by the facility where your surgery was performed. IF YOU HAVE DISABILITY OR FAMILY LEAVE FORMS, YOU MUST BRING THEM TO THE OFFICE FOR PROCESSING.   DO NOT GIVE THEM TO YOUR DOCTOR.  1. A prescription for pain medication may be given to you upon discharge.  Take your pain medication as prescribed, if needed.  If narcotic pain medicine is not needed, then you may take acetaminophen (Tylenol) or ibuprofen (Advil) as needed. 2. Take your usually prescribed medications unless otherwise directed. 3. If you need a refill on your pain medication, please contact your pharmacy.  They will contact our office to request authorization. Prescriptions will not be filled after 5pm or on week-ends. 4. You should follow a light diet the first few days after arrival home, such as soup and crackers, etc.  Be sure to include lots of fluids daily. 5. Most patients will experience some swelling and bruising in the area of the incisions.  Ice packs will help.  Swelling and bruising can take several days to resolve.  6. It is common to experience  some constipation if taking pain medication after surgery.  Increasing fluid intake and taking a stool softener (such as Colace) will usually help or prevent this problem from occurring.  A mild laxative (Milk of Magnesia or Miralax) should be taken according to package instructions if there are no bowel movements  after 48 hours. 7. Unless discharge instructions indicate otherwise, you may remove your bandages 24-48 hours after surgery, and you may shower at that time.  You may have steri-strips (small skin tapes) in place directly over the incision.  These strips should be left on the skin for 7-10 days.  If your surgeon used skin glue on the incision, you may shower in 24 hours.  The glue will flake off over the next 2-3 weeks.  Any sutures or staples will be removed at the office during your follow-up visit. 8. ACTIVITIES:  You may resume regular (light) daily activities beginning the next day--such as daily self-care, walking, climbing stairs--gradually increasing activities as tolerated.  You may have sexual intercourse when it is comfortable.  Refrain from any heavy lifting or straining until approved by your doctor. a. You may drive when you are no longer taking prescription pain medication, you can comfortably wear a seatbelt, and you can safely maneuver your car and apply brakes. b. RETURN TO WORK:  __________________________________________________________ 9. You should see your doctor in the office for a follow-up appointment approximately 2-3 weeks after your surgery.  Make sure that you call for this appointment within a day or two after you arrive home to insure a convenient appointment time. 10. OTHER INSTRUCTIONS: __________________________________________________________________________________________________________________________ __________________________________________________________________________________________________________________________ WHEN TO CALL YOUR DOCTOR: 1. Fever over 101.0 2. Inability to urinate 3. Continued bleeding from incision. 4. Increased pain, redness, or drainage from the incision. 5. Increasing abdominal pain  The clinic staff is available to answer your questions during regular business hours.  Please dont hesitate to call and ask to speak to one of the  nurses for clinical concerns.  If you have a medical emergency, go to the nearest emergency room or call 911.  A surgeon from [facility] is always on call at the hospital. [address], [place], [place], [place]  [address] ? [address], [place],    [address] [phone] ? [phone] ? FAX [phone] Web site: [email or web address]

## 2013-07-07 NOTE — Discharge Summary (Signed)
Brandy Denes M. Zubin Pontillo, MD, FACS General, Bariatric, & Minimally Invasive Surgery [facility]  

## 2013-07-08 ENCOUNTER — Telehealth (INDEPENDENT_AMBULATORY_CARE_PROVIDER_SITE_OTHER): Payer: Self-pay | Admitting: General Surgery

## 2013-07-08 NOTE — Telephone Encounter (Signed)
LMOM for patient to call back and make apt to see Dr. [name]. Patient can ask for [facility]

## 2013-07-08 NOTE — Telephone Encounter (Signed)
Message copied by Maryclare Bean on Wed Jul 08, 2013  2:01 PM ------      Message from: Salvatore Marvel      Created: Wed Jul 08, 2013 11:39 AM      Regarding: Dr. [name] 1st po Lap appy      Contact: [phone]       Patient had Lap appendectomy on 07/06/13 with Dr. [name] and needs 1st po appt, please could you schedule.            Thank you. ------

## 2013-08-06 ENCOUNTER — Encounter (INDEPENDENT_AMBULATORY_CARE_PROVIDER_SITE_OTHER): Payer: Self-pay | Admitting: General Surgery

## 2013-08-06 ENCOUNTER — Ambulatory Visit (INDEPENDENT_AMBULATORY_CARE_PROVIDER_SITE_OTHER): Payer: BC Managed Care – PPO | Admitting: General Surgery

## 2013-08-06 VITALS — BP 120/82 | HR 72 | Temp 97.4°F | Resp 16 | Ht 67.5 in | Wt 239.4 lb

## 2013-08-06 DIAGNOSIS — Z09 Encounter for follow-up examination after completed treatment for conditions other than malignant neoplasm: Secondary | ICD-10-CM

## 2013-08-06 NOTE — Progress Notes (Signed)
Subjective:     Patient ID: Brandy Harris, female   DOB: [birth year], 53 y.o.   MRN: [id number]  HPI 53 year old female comes in for followup after undergoing laparoscopic appendectomy approximately one month ago. She states that she is doing well. She denies any fever, chills, nausea or vomiting. The symptoms that she had preoperatively have resolved. She reports a good appetite  Review of Systems     Objective:   Physical Exam BP 120/82  Pulse 72  Temp(Src) 97.4 F (36.3 C) (Temporal)  Resp 16  Ht 5' 7.5" (1.715 m)  Wt 239 lb 6.4 oz (108.591 kg)  BMI 36.92 kg/m2 Alert, no apparent stress Abdomen-soft, nontender, nondistended. Obese. Small scab at infraumbilical trocar site. No cellulitis. No incisional hernia    Assessment:     Status post laparoscopic appendectomy     Plan:     I reviewed her pathology report which showed essentially a benign appendix. She states that the symptoms she had preoperatively have resolved. I did discuss the other intra-operative finding of bilateral ovarian cysts with the patient. I informed her she should discuss this with her OB/GYN. Advised her should she have recurrent abdominal pain and this would probably be the best source . Followup as needed  [name]. [name], MD, FACS General, Bariatric, & Minimally Invasive Surgery [facility], [place]

## 2013-08-06 NOTE — Patient Instructions (Signed)
Can resume full activities

## 2014-02-16 ENCOUNTER — Other Ambulatory Visit: Payer: Self-pay

## 2014-02-16 DIAGNOSIS — Z1231 Encounter for screening mammogram for malignant neoplasm of breast: Secondary | ICD-10-CM

## 2014-03-17 ENCOUNTER — Ambulatory Visit
Admission: RE | Admit: 2014-03-17 | Discharge: 2014-03-17 | Disposition: A | Discharge status: Home / Self Care | Payer: BC Managed Care – PPO | Source: Ambulatory Visit

## 2014-03-17 DIAGNOSIS — Z1231 Encounter for screening mammogram for malignant neoplasm of breast: Secondary | ICD-10-CM

## 2014-09-10 ENCOUNTER — Other Ambulatory Visit (HOSPITAL_COMMUNITY)
Admission: RE | Admit: 2014-09-10 | Discharge: 2014-09-10 | Disposition: A | Discharge status: Home / Self Care | Payer: BC Managed Care – PPO | Source: Ambulatory Visit | Attending: Family Medicine | Admitting: Family Medicine

## 2014-09-10 ENCOUNTER — Other Ambulatory Visit: Payer: Self-pay | Admitting: Family Medicine

## 2014-09-10 DIAGNOSIS — Z01419 Encounter for gynecological examination (general) (routine) without abnormal findings: Secondary | ICD-10-CM | POA: Diagnosis present

## 2014-09-14 LAB — CYTOLOGY - PAP

## 2016-01-20 ENCOUNTER — Emergency Department
Admission: EM | Admit: 2016-01-20 | Discharge: 2016-01-20 | Disposition: A | Discharge status: Other Acute Inpatient Hospital | Payer: BC Managed Care – PPO

## 2016-01-20 NOTE — ED Notes (Signed)
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

## 2016-03-15 ENCOUNTER — Encounter: Payer: Self-pay | Admitting: *Deleted

## 2016-03-15 ENCOUNTER — Encounter: Payer: Self-pay | Admitting: Podiatry

## 2016-03-15 ENCOUNTER — Ambulatory Visit (INDEPENDENT_AMBULATORY_CARE_PROVIDER_SITE_OTHER): Payer: BC Managed Care – PPO

## 2016-03-15 ENCOUNTER — Other Ambulatory Visit: Payer: Self-pay | Admitting: *Deleted

## 2016-03-15 ENCOUNTER — Ambulatory Visit (INDEPENDENT_AMBULATORY_CARE_PROVIDER_SITE_OTHER): Payer: BC Managed Care – PPO | Admitting: Podiatry

## 2016-03-15 VITALS — BP 114/82 | HR 91 | Resp 16

## 2016-03-15 DIAGNOSIS — M7662 Achilles tendinitis, left leg: Secondary | ICD-10-CM

## 2016-03-15 DIAGNOSIS — J309 Allergic rhinitis, unspecified: Secondary | ICD-10-CM | POA: Insufficient documentation

## 2016-03-15 DIAGNOSIS — M79672 Pain in left foot: Secondary | ICD-10-CM

## 2016-03-15 DIAGNOSIS — E039 Hypothyroidism, unspecified: Secondary | ICD-10-CM | POA: Insufficient documentation

## 2016-03-15 MED ORDER — MELOXICAM 15 MG PO TABS: 15.0000 mg | ORAL_TABLET | Freq: Every day | ORAL | 3 refills | Status: DC

## 2016-03-15 MED ORDER — METHYLPREDNISOLONE 4 MG PO TBPK: ORAL_TABLET | ORAL | 0 refills | Status: DC

## 2016-03-15 NOTE — Patient Instructions (Signed)

## 2016-03-15 NOTE — Progress Notes (Signed)
   Subjective:    Patient ID: Brandy Harris, female    DOB: 07/25/1960, 55 y.o.   MRN: [id number]  HPI: She presents today as a 55 year old female with a chief complaint of pain to the posterior aspect of the left heel. She is status post an aching and burning with pulling sensations now for the past few months. Mornings are particularly bad with some swelling at night and she is really done nothing for treatment. She denies any trauma.    Review of Systems  HENT: Positive for hearing loss.   All other systems reviewed and are negative.      Objective:   Physical Exam: Vital signs are stable she is alert and oriented 3. Pulses are palpable. Neurologic sensorium is intact per Semmes-Weinstein monofilament. Deep tendon reflexes are intact. Muscle strength is 5 over 5 dorsiflexion plantar flexors and inverters everters onto the musculatures intact. She has pain on direct palpation of the posterior inferior aspect of the right calcaneus with no erythema cellulitis drainage or odor. Radiographs taken today do demonstrate a posterior proximally or any calcaneal heel spur and a plantar distally oriented calcaneal heel spur. Cutaneous evaluation of Mr. supple well-hydrated cutis no lesions or wounds.        Assessment & Plan:  Assessment: Achilles tendinitis with heel spur right.  Plan: Start her on a Medrol Dosepak to be followed by meloxicam. Inject a small amount of dexamethasone less than 2 mg to the point of maximal tenderness of the right heel. We made sure not to inject directly into the tendon. We discussed appropriate ice therapy and I placed her in a Cam Walker for daytime use. We dispensed night splint for nighttime use. I will follow-up with her in 1 month we also dispensed stretching exercises for the Achilles tendon.

## 2016-03-26 DIAGNOSIS — M7582 Other shoulder lesions, left shoulder: Secondary | ICD-10-CM

## 2016-03-26 HISTORY — DX: Other shoulder lesions, left shoulder: M75.82

## 2016-04-12 ENCOUNTER — Ambulatory Visit: Payer: BC Managed Care – PPO | Admitting: Podiatry

## 2016-04-17 ENCOUNTER — Encounter: Payer: Self-pay | Admitting: Podiatry

## 2016-04-17 ENCOUNTER — Ambulatory Visit (INDEPENDENT_AMBULATORY_CARE_PROVIDER_SITE_OTHER): Payer: BC Managed Care – PPO | Admitting: Podiatry

## 2016-04-17 DIAGNOSIS — M7662 Achilles tendinitis, left leg: Secondary | ICD-10-CM | POA: Diagnosis not present

## 2016-04-18 NOTE — Progress Notes (Signed)
She presents today stating that she is doing much better. She continues to wear the boot and she completed her Medrol Dosepak.  Objective: Vital signs are stable she is alert and oriented 3. She is tenderness on palpation of the Achilles tendon overlying the small nodule which was probably interstitial tear. This is along the watershed area laterally.  Assessment: Achilles tendinitis slowly resolving.  Plan: I recommended that she continue the use of the Cam Walker and I reinjected the area today with dexamethasone and local anesthetic injecting into the anterior fat pad of the pre-Achilles area. I'll follow-up with her in approximately a month. If this is not resolved and we will consider an MRI for evaluation.

## 2016-05-10 ENCOUNTER — Ambulatory Visit (INDEPENDENT_AMBULATORY_CARE_PROVIDER_SITE_OTHER): Payer: BC Managed Care – PPO | Admitting: Podiatry

## 2016-05-10 ENCOUNTER — Telehealth: Payer: Self-pay | Admitting: *Deleted

## 2016-05-10 ENCOUNTER — Encounter: Payer: Self-pay | Admitting: Podiatry

## 2016-05-10 DIAGNOSIS — M7662 Achilles tendinitis, left leg: Secondary | ICD-10-CM | POA: Diagnosis not present

## 2016-05-10 NOTE — Telephone Encounter (Addendum)
MRI orders faxed to [facility] and given to D. Meadows for [employer]. 05/21/2016-Pt called for referral for MRI. I called pt and she states [facility] called and she is scheduled for 05/27/2016. 07/09/2016-Pt states she has sores in her mouth and wanted to know if it was from her medications prescribed on 07/06/2016.07/10/2016-Left message informing pt Dr. Milinda Pointer wanted her to stop the antibiotic and he ordered a mouthwash to help with the sores in her mouth and to call with the pharmacy. Pt states she uses the [facility] on [address].

## 2016-05-10 NOTE — Progress Notes (Signed)
She presents today for follow-up of her Achilles tendinitis and states it is approximately 98% improved. However when I compress the posterior aspect of the heel she had severe pain and there is still warmth in the Achilles.  Objective: Vital signs are stable she's alert and oriented 3 she has pain on palpation of the Achilles tendon of the left heel. Larger calcaneal heel spurs present.  Assessment: Chronic Achilles tendinitis left.  Plan: At this point a requesting an MRI to evaluate the Achilles and the integrity of the prior to surgical intervention.

## 2016-05-27 ENCOUNTER — Ambulatory Visit
Admission: RE | Admit: 2016-05-27 | Discharge: 2016-05-27 | Disposition: A | Discharge status: Home / Self Care | Payer: BC Managed Care – PPO | Source: Ambulatory Visit | Attending: Podiatry | Admitting: Podiatry

## 2016-05-27 DIAGNOSIS — M7662 Achilles tendinitis, left leg: Secondary | ICD-10-CM

## 2016-05-28 NOTE — Telephone Encounter (Addendum)
-----   Message from Garrel Ridgel, [place] sent at 05/28/2016  7:57 AM EST ----- Send for over read and inform patient of the delay. Left message informing pt of Dr. Stephenie Acres orders and explained the delay. Mailed MRI disc to [facility]. Pt asked more details concerning the overread. I told pt Dr. Milinda Pointer was very detail oriented and he wanted more detailed informing to plan her treatment.

## 2016-06-04 ENCOUNTER — Encounter: Payer: Self-pay | Admitting: Podiatry

## 2016-06-07 ENCOUNTER — Encounter: Payer: Self-pay | Admitting: Podiatry

## 2016-06-07 ENCOUNTER — Ambulatory Visit (INDEPENDENT_AMBULATORY_CARE_PROVIDER_SITE_OTHER): Payer: BC Managed Care – PPO | Admitting: Podiatry

## 2016-06-07 DIAGNOSIS — S86012D Strain of left Achilles tendon, subsequent encounter: Secondary | ICD-10-CM

## 2016-06-07 DIAGNOSIS — M7662 Achilles tendinitis, left leg: Secondary | ICD-10-CM

## 2016-06-07 NOTE — Patient Instructions (Signed)

## 2016-06-07 NOTE — Progress Notes (Signed)
She presents today for follow-up of her MRI findings. She states the foot is still the same and she refers to the left foot posteriorly and laterally. She denies in changes in past medical history medications allergy surgery or social history.  Objective: Vital signs are stable alert and oriented 3 she still has pain on palpation of peroneal tendons as well as the Achilles of the left heel.  MRI reveals a split tear of the peroneal brevis tendon of the left foot and for malleolar and posterior malleolar regions left. She also demonstrates a chronic Achilles tendinopathy with a small split tear at its insertion site on the retrocalcaneal heel spur.  Assessment: Achilles tendinitis with split tear. She also has peroneal tendinitis was splinted.  Plan: Discussed etiology pathology conservative versus surgical therapies at this point we discussed surgical correction consisting of a gastroc recession Achilles tendon lysis retrocalcaneal heel spur resection and a peroneal tendon repair all with the cast. She understands this is amenable to it we discussed the pros and cons of the surgery and anesthesia as well as the use of the [facility]. She understands that she will be restricted from working for at least 2 weeks and that she will be nonweightbearing in a cast with the necessity of knee scooter and crutches. I will follow-up with her in the near future for surgical intervention.

## 2016-06-08 ENCOUNTER — Telehealth: Payer: Self-pay | Admitting: *Deleted

## 2016-06-08 NOTE — Telephone Encounter (Signed)
I calling to set up your surgery with Dr. Milinda Pointer.  Do you have a date in mind?  "I want to do it as soon as possible."  His first available is April 13.  "That date is fine.  I will put it on my calendar."  You can register with the surgical center now, instructions are in the brochure in your bag.  Someone from the surgical center will call you a day or two prior to surgery date with the arrival time.

## 2016-06-28 ENCOUNTER — Encounter: Payer: Self-pay | Admitting: Podiatry

## 2016-07-04 ENCOUNTER — Other Ambulatory Visit: Payer: Self-pay | Admitting: Podiatry

## 2016-07-04 MED ORDER — HYDROMORPHONE HCL 4 MG PO TABS: 4.0000 mg | ORAL_TABLET | ORAL | 0 refills | Status: DC | PRN

## 2016-07-04 MED ORDER — ONDANSETRON HCL 4 MG PO TABS: 4.0000 mg | ORAL_TABLET | Freq: Three times a day (TID) | ORAL | 0 refills | Status: DC | PRN

## 2016-07-04 MED ORDER — CEPHALEXIN 500 MG PO CAPS: 500.0000 mg | ORAL_CAPSULE | Freq: Three times a day (TID) | ORAL | 0 refills | Status: DC

## 2016-07-06 ENCOUNTER — Encounter: Payer: Self-pay | Admitting: Podiatry

## 2016-07-06 DIAGNOSIS — M216X2 Other acquired deformities of left foot: Secondary | ICD-10-CM | POA: Diagnosis not present

## 2016-07-06 DIAGNOSIS — M67472 Ganglion, left ankle and foot: Secondary | ICD-10-CM | POA: Diagnosis not present

## 2016-07-06 DIAGNOSIS — M7662 Achilles tendinitis, left leg: Secondary | ICD-10-CM | POA: Diagnosis not present

## 2016-07-06 DIAGNOSIS — M7732 Calcaneal spur, left foot: Secondary | ICD-10-CM | POA: Diagnosis not present

## 2016-07-08 ENCOUNTER — Encounter: Payer: Self-pay | Admitting: Podiatry

## 2016-07-12 ENCOUNTER — Ambulatory Visit (INDEPENDENT_AMBULATORY_CARE_PROVIDER_SITE_OTHER): Payer: BC Managed Care – PPO | Admitting: Podiatry

## 2016-07-12 ENCOUNTER — Ambulatory Visit (INDEPENDENT_AMBULATORY_CARE_PROVIDER_SITE_OTHER): Payer: BC Managed Care – PPO

## 2016-07-12 ENCOUNTER — Encounter: Payer: Self-pay | Admitting: Podiatry

## 2016-07-12 VITALS — BP 120/85 | HR 104 | Temp 98.6°F

## 2016-07-12 DIAGNOSIS — M7662 Achilles tendinitis, left leg: Secondary | ICD-10-CM

## 2016-07-12 DIAGNOSIS — Z9889 Other specified postprocedural states: Secondary | ICD-10-CM

## 2016-07-12 DIAGNOSIS — S86012D Strain of left Achilles tendon, subsequent encounter: Secondary | ICD-10-CM | POA: Diagnosis not present

## 2016-07-12 NOTE — Progress Notes (Signed)
She presents today 1 week status post gastroc recession Achilles tendon lysis peroneal tendon repair and retrocalcaneal heel spur resection. She denies fever chills nausea vomiting muscle aches and pains and states the cast hasn't been too bad. She denies chest pain shortness of breath.  Objective: Vital signs are stable she is alert and oriented 3 her toes are sensitive and she has good range of motion. Pain in the cast appears to be loose proximally.  Assessment: Well-healing surgical foot.  Plan: Continue nonweightbearing status follow up with me in 1 week for cast change.

## 2016-07-19 ENCOUNTER — Ambulatory Visit (INDEPENDENT_AMBULATORY_CARE_PROVIDER_SITE_OTHER): Payer: BC Managed Care – PPO | Admitting: Podiatry

## 2016-07-19 DIAGNOSIS — S86012D Strain of left Achilles tendon, subsequent encounter: Secondary | ICD-10-CM | POA: Diagnosis not present

## 2016-07-19 DIAGNOSIS — Z9889 Other specified postprocedural states: Secondary | ICD-10-CM

## 2016-07-19 NOTE — Progress Notes (Signed)
She presents today 2 weeks status post gastroc recession peroneal tendon repair Achilles tendon lysis and retrocalcaneal heel spur resection with a cast. She denies fever chills nausea vomiting muscle experience chest pain shortness of breath.  Objective: Dry sterile dressing was intact once the cast was removed. Cast is clean and dry. No erythema is mild edema no cellulitis drainage or odor. Half of the staples were removed today. There are no signs of dehiscence or signs of infection.  Assessment: Well-healing surgical foot left.  Plan: [place] today dressing compressive dressing will follow up with her in 2 weeks to remove the cast was applied today.

## 2016-08-02 ENCOUNTER — Ambulatory Visit (INDEPENDENT_AMBULATORY_CARE_PROVIDER_SITE_OTHER): Payer: BC Managed Care – PPO | Admitting: Podiatry

## 2016-08-02 DIAGNOSIS — S86012D Strain of left Achilles tendon, subsequent encounter: Secondary | ICD-10-CM | POA: Diagnosis not present

## 2016-08-05 NOTE — Progress Notes (Signed)
She presents today for postop visit date of surgery 07/06/2016 status post repair of her posterior leg as far as the Achilles tendon gastroc recession. She states that she's doing pretty well.  Objective: Vital signs are stable she is alert and oriented 3. Pulses are palpable. Neurologic sensorium is intact. Cast was removed today all of her staples were removed with considerable discomfort. But all of her margins appear to be healing.  Assessment: Well-healing surgical foot left.  Plan: We'll allow her to start partial weightbearing over the next couple of weeks utilizing her knee scooter and Cam Walker. I will follow-up with her in about 3 weeks.

## 2016-08-09 ENCOUNTER — Ambulatory Visit (INDEPENDENT_AMBULATORY_CARE_PROVIDER_SITE_OTHER): Payer: Self-pay | Admitting: Podiatry

## 2016-08-09 ENCOUNTER — Encounter: Payer: Self-pay | Admitting: Podiatry

## 2016-08-09 DIAGNOSIS — Z9889 Other specified postprocedural states: Secondary | ICD-10-CM

## 2016-08-09 DIAGNOSIS — M7662 Achilles tendinitis, left leg: Secondary | ICD-10-CM

## 2016-08-11 NOTE — Progress Notes (Signed)
She presents today for a postop visit she has status post gastroc recession Achilles and lysis retrocalcaneal spur resection on the left foot. She states that she is doing quite well she's approximate 6 weeks out at this point.  Objective: Vital signs are stable she is alert and oriented 3. Pulses are palpable. She has great range of motion all margins are well coapted.  Assessment: Well-healed surgical foot.  Plan: I demonstrated for her today how to proceed with partial weightbearing progressing to full weightbearing and I will follow-up with her in 2 weeks.

## 2016-08-14 NOTE — Progress Notes (Signed)
1. Gastrocnemius recession left 2. Achilles tenolysis left 3. Heel spur resection left 4. Peroneus Brevis repair left 5. Cast left

## 2016-08-28 ENCOUNTER — Encounter: Payer: Self-pay | Admitting: Podiatry

## 2016-08-28 ENCOUNTER — Ambulatory Visit (INDEPENDENT_AMBULATORY_CARE_PROVIDER_SITE_OTHER): Payer: BC Managed Care – PPO | Admitting: Podiatry

## 2016-08-28 VITALS — BP 117/76 | HR 106 | Resp 16

## 2016-08-28 DIAGNOSIS — M7662 Achilles tendinitis, left leg: Secondary | ICD-10-CM | POA: Diagnosis not present

## 2016-08-28 DIAGNOSIS — Z9889 Other specified postprocedural states: Secondary | ICD-10-CM

## 2016-08-29 ENCOUNTER — Encounter: Payer: Self-pay | Admitting: Podiatry

## 2016-08-29 NOTE — Progress Notes (Signed)
She presents today date of surgery 07/06/2016 left foot states that the foot is doing okay not in any pain she states that she continues to use a scooter at work but on walking a little bit on the foot using crutches and the boot.  Objective: Vital signs are stable she is alert and oriented 3. Pulses are palpable. Neurologic symptoms from his intact. The tendon reflexes are intact. She has great range of motion no open lesions no tenderness on palpation. She has full plantarflexion against resistance without pain.  Assessment: Well-healing surgical foot and leg.  Plan: Placed her in a Tri-Lock brace, allow her to ambulate alternating between the cam walker and a tennis shoe. She will use crutches if necessary. I will follow up with her in 1 month. She will continue light duty at work or half days.

## 2016-09-25 ENCOUNTER — Ambulatory Visit (INDEPENDENT_AMBULATORY_CARE_PROVIDER_SITE_OTHER): Payer: BC Managed Care – PPO | Admitting: Podiatry

## 2016-09-25 ENCOUNTER — Encounter: Payer: Self-pay | Admitting: Podiatry

## 2016-09-25 DIAGNOSIS — M7662 Achilles tendinitis, left leg: Secondary | ICD-10-CM

## 2016-09-25 NOTE — Progress Notes (Signed)
She presents today for follow-up of her Achilles tendon repair endoscopic fasciotomy gastroc recession and peroneal tendon repair. She states that she continues wearing her brace regularly but she does not wear her boot any longer. She still walks with a mild antalgic gait she states that all in all she does quite well until she is on it for a long time which causes significant discomfort.  Objective: Vital signs are stable she is alert and oriented 3. The foot is looking the best that I have seen it. She states that is better than it was prior to surgery. The edema currently is almost nonexistent. Swelling to the posterior aspect of the foot has gone down considerably she has nice margins of the Achilles palpable on the posterior leg and also good margins on the peroneal tendon of the left ankle.  Assessment: Well-healing surgical foot left.  Plan: I encouraged her to continue all the activities that she desires to perform I will follow-up with her in 2 months if necessary.

## 2016-10-16 ENCOUNTER — Encounter (HOSPITAL_BASED_OUTPATIENT_CLINIC_OR_DEPARTMENT_OTHER): Payer: Self-pay | Admitting: *Deleted

## 2016-10-16 ENCOUNTER — Emergency Department (HOSPITAL_BASED_OUTPATIENT_CLINIC_OR_DEPARTMENT_OTHER)
Admission: EM | Admit: 2016-10-16 | Discharge: 2016-10-16 | Disposition: A | Discharge status: Home / Self Care | Payer: BC Managed Care – PPO | Attending: Emergency Medicine | Admitting: Emergency Medicine

## 2016-10-16 DIAGNOSIS — I10 Essential (primary) hypertension: Secondary | ICD-10-CM | POA: Diagnosis not present

## 2016-10-16 DIAGNOSIS — E079 Disorder of thyroid, unspecified: Secondary | ICD-10-CM | POA: Diagnosis not present

## 2016-10-16 DIAGNOSIS — L509 Urticaria, unspecified: Secondary | ICD-10-CM

## 2016-10-16 DIAGNOSIS — E039 Hypothyroidism, unspecified: Secondary | ICD-10-CM | POA: Insufficient documentation

## 2016-10-16 DIAGNOSIS — Z79899 Other long term (current) drug therapy: Secondary | ICD-10-CM | POA: Insufficient documentation

## 2016-10-16 DIAGNOSIS — L299 Pruritus, unspecified: Secondary | ICD-10-CM | POA: Insufficient documentation

## 2016-10-16 DIAGNOSIS — R600 Localized edema: Secondary | ICD-10-CM | POA: Diagnosis present

## 2016-10-16 DIAGNOSIS — T783XXA Angioneurotic edema, initial encounter: Secondary | ICD-10-CM | POA: Diagnosis not present

## 2016-10-16 HISTORY — DX: Disorder of thyroid, unspecified: E07.9

## 2016-10-16 MED ORDER — FAMOTIDINE 20 MG PO TABS
20.0000 mg | ORAL_TABLET | Freq: Every day | ORAL | 0 refills | Status: DC
Start: 2016-10-16 — End: 2018-11-20

## 2016-10-16 MED ORDER — DIPHENHYDRAMINE HCL 25 MG PO CAPS: 25.0000 mg | ORAL_CAPSULE | Freq: Four times a day (QID) | ORAL | 0 refills | Status: DC | PRN

## 2016-10-16 MED ORDER — METHYLPREDNISOLONE SODIUM SUCC 125 MG IJ SOLR
125.0000 mg | Freq: Once | INTRAMUSCULAR | Status: AC
  Administered 2016-10-16: 125 mg via INTRAVENOUS
  Filled 2016-10-16: qty 2

## 2016-10-16 MED ORDER — DIPHENHYDRAMINE HCL 50 MG/ML IJ SOLN
25.0000 mg | Freq: Once | INTRAMUSCULAR | Status: AC
  Administered 2016-10-16: 25 mg via INTRAVENOUS
  Filled 2016-10-16: qty 1

## 2016-10-16 MED ORDER — PREDNISONE 20 MG PO TABS: 40.0000 mg | ORAL_TABLET | Freq: Every day | ORAL | 0 refills | Status: DC

## 2016-10-16 MED ORDER — FAMOTIDINE IN NACL 20-0.9 MG/50ML-% IV SOLN
20.0000 mg | Freq: Once | INTRAVENOUS | Status: AC
  Administered 2016-10-16: 20 mg via INTRAVENOUS
  Filled 2016-10-16: qty 50

## 2016-10-16 NOTE — ED Notes (Signed)
Improved edema to lower lip.

## 2016-10-16 NOTE — Discharge Instructions (Signed)
You were seen today for swelling of her lip. This is called angioedema. It can be related to allergic reaction but also is fairly closely related to ACE inhibitor use. Discontinued lisinopril. Follow-up with her primary physician on Friday for additional recommendations for blood pressure. If you have any new or worsening symptoms you should be reevaluated.

## 2016-10-16 NOTE — ED Triage Notes (Signed)
Swelling to bottom lip x 2 hours denies difficulty breathing

## 2016-10-16 NOTE — ED Provider Notes (Signed)
[facility] Provider Note   CSN: [id number] Arrival date & time: 10/16/16  0042     History   Chief Complaint Chief Complaint  Patient presents with  . Facial Swelling    HPI Brandy Harris is a 56 y.o. female.  HPI  This is a 56 year old female who presents with lower lip swelling. Patient reports 2 hour onset of lower lip swelling. She has also noted itchiness to her bilateral hands and legs. She does take lisinopril. No history of the same. No new exposures including medications, foods, detergents, soaps. She took Benadryl which improved the itching but did not help with the swelling. Denies any difficulty swallowing, shortness of breath, nausea, vomiting.  Past Medical History:  Diagnosis Date  . Difficult intubation   . Hypertension   . Meniere disease   . Thyroid disease     Patient Active Problem List   Diagnosis Date Noted  . Allergic rhinitis 03/15/2016  . Hypothyroidism (acquired) 03/15/2016  . Epigastric abdominal pain 07/06/2013  . HTN (hypertension) 07/06/2013  . Meniere disease 07/06/2013    Past Surgical History:  Procedure Laterality Date  . LAPAROSCOPIC APPENDECTOMY N/A 07/06/2013   Procedure: Diagnostic laparoscopy,  laparoscopic appendectomy;  Surgeon: Gayland Curry, MD;  Location: [facility];  Service: General;  Laterality: N/A;  . THYROIDECTOMY    . TONSILLECTOMY      OB History    No data available       Home Medications    Prior to Admission medications   Medication Sig Start Date End Date Taking? Authorizing Provider  azelastine (ASTELIN) 137 MCG/SPRAY nasal spray Place 1 spray into the nose 2 (two) times daily. Use in each nostril as directed    [provider]  CLARINEX-D 12 HOUR 2.5-120 MG 12 hr tablet  02/28/16   [provider]  desloratadine (CLARINEX) 5 MG tablet Take by mouth.    [provider]  diazepam (VALIUM) 2 MG tablet Take by mouth.    [provider]  diphenhydrAMINE  (BENADRYL) 25 mg capsule Take 1 capsule (25 mg total) by mouth every 6 (six) hours as needed. 10/16/16   Hobart Marte, Barbette Hair, MD  famotidine (PEPCID) 20 MG tablet Take 1 tablet (20 mg total) by mouth daily. 10/16/16   Orianna Biskup, Barbette Hair, MD  ibuprofen (ADVIL) 200 MG tablet 2-3 tablets every 8 hours as needed for pain. 07/07/13   Earnstine Regal, PA-C  levothyroxine (SYNTHROID, LEVOTHROID) 137 MCG tablet Take 137 mcg by mouth daily before breakfast.    [provider]  meloxicam (MOBIC) 15 MG tablet Take 1 tablet (15 mg total) by mouth daily. 03/15/16   Hyatt, Max T, DPM  mometasone (NASONEX) 50 MCG/ACT nasal spray Place 2 sprays into the nose daily.    [provider]  omeprazole (PRILOSEC) 20 MG capsule Take 1 capsule (20 mg total) by mouth daily. 11/08/11 11/07/12  Molpus, Jenny Reichmann, MD  ondansetron (ZOFRAN) 4 MG tablet Take 1 tablet (4 mg total) by mouth every 8 (eight) hours as needed. 07/04/16   Hyatt, Max T, DPM  predniSONE (DELTASONE) 20 MG tablet Take 2 tablets (40 mg total) by mouth daily. 10/16/16   Yifan Auker, Barbette Hair, MD  triamterene-hydrochlorothiazide (MAXZIDE) 75-50 MG tablet Take 1 tablet by mouth every morning. 12/17/15   [provider]  Vitamin D, Ergocalciferol, (DRISDOL) 50000 units CAPS capsule Take by mouth.    [provider]    Family History History reviewed. No pertinent family history.  Social  History Social History  Substance Use Topics  . Smoking status: Never Smoker  . Smokeless tobacco: Never Used  . Alcohol use Yes     Comment: occasionally     Allergies   Cephalexin and Sulfa antibiotics   Review of Systems Review of Systems  HENT: Negative for trouble swallowing.        Lip swelling  Respiratory: Negative for shortness of breath.   Cardiovascular: Negative for chest pain.  Gastrointestinal: Negative for abdominal pain, nausea and vomiting.  Skin: Positive for rash.  All other systems reviewed and are  negative.    Physical Exam Updated Vital Signs BP (!) 142/83 (BP Location: Right Arm)   Pulse (!) 104   Temp 97.9 F (36.6 C) (Oral)   Resp 18   Ht 5' 7.5" (1.715 m)   Wt 108 kg (238 lb)   LMP 08/30/2016   SpO2 97%   BMI 36.73 kg/m   Physical Exam  Constitutional: She is oriented to person, place, and time. She appears well-developed and well-nourished. No distress.  ABCs intact  HENT:  Head: Normocephalic and atraumatic.  Swelling noted to the bottom lip, no tongue or additional or pharyngeal swelling, uvula midline  Eyes: Pupils are equal, round, and reactive to light.  Cardiovascular: Normal rate, regular rhythm and normal heart sounds.   No murmur heard. Pulmonary/Chest: Effort normal and breath sounds normal. No respiratory distress. She has no wheezes.  Musculoskeletal: She exhibits no edema.  Neurological: She is alert and oriented to person, place, and time.  Skin: Skin is warm and dry.  Redness noted in the web spacing of the bilateral hands, hives noted right lower leg  Psychiatric: She has a normal mood and affect.  Nursing note and vitals reviewed.    ED Treatments / Results  Labs (all labs ordered are listed, but only abnormal results are displayed) Labs Reviewed - No data to display  EKG  EKG Interpretation None       Radiology No results found.  Procedures Procedures (including critical care time)  Medications Ordered in ED Medications  methylPREDNISolone sodium succinate (SOLU-MEDROL) 125 mg/2 mL injection 125 mg (125 mg Intravenous Given 10/16/16 0128)  diphenhydrAMINE (BENADRYL) injection 25 mg (25 mg Intravenous Given 10/16/16 0128)  famotidine (PEPCID) IVPB 20 mg premix (20 mg Intravenous New Bag/Given 10/16/16 0129)     Initial Impression / Assessment and Plan / ED Course  I have reviewed the triage vital signs and the nursing notes.  Pertinent labs & imaging results that were available during my care of the patient were reviewed by  me and considered in my medical decision making (see chart for details).  Clinical Course as of Oct 17 250  Tue Oct 16, 2016  0212 Some improvement of lip swelling noted.  Hives improving. Will continue to monitor.  [CH]    Clinical Course User Index [CH] Siddharth Babington, Barbette Hair, MD    Patient presents with lower lip swelling. She also additionally has a isolated hives and some erythema to the bilateral hands that is itchy. She denies any new exposures. Clinically her exam is most consistent with angioedema.  She does take lisinopril. However, given additional skin findings, could be an IgE mediated allergy.  Patient given Pepcid, Benadryl, and prednisone. She is monitored. She has had some improvement of both the lip swelling and hives have disappeared. Unclear etiology of reaction. Recommend discontinuing lisinopril. She has follow-up with her primary physician on Friday. Will discharge home with prednisone and  close primary care follow-up.  After history, exam, and medical workup I feel the patient has been appropriately medically screened and is safe for discharge home. Pertinent diagnoses were discussed with the patient. Patient was given return precautions.   Final Clinical Impressions(s) / ED Diagnoses   Final diagnoses:  Angioedema, initial encounter  Hives    New Prescriptions New Prescriptions   DIPHENHYDRAMINE (BENADRYL) 25 MG CAPSULE    Take 1 capsule (25 mg total) by mouth every 6 (six) hours as needed.   FAMOTIDINE (PEPCID) 20 MG TABLET    Take 1 tablet (20 mg total) by mouth daily.   PREDNISONE (DELTASONE) 20 MG TABLET    Take 2 tablets (40 mg total) by mouth daily.     Merryl Hacker, MD 10/16/16 [phone]

## 2016-10-19 ENCOUNTER — Other Ambulatory Visit (HOSPITAL_COMMUNITY)
Admission: RE | Admit: 2016-10-19 | Discharge: 2016-10-19 | Disposition: A | Discharge status: Home / Self Care | Payer: BC Managed Care – PPO | Source: Ambulatory Visit | Attending: Family Medicine | Admitting: Family Medicine

## 2016-10-19 ENCOUNTER — Other Ambulatory Visit: Payer: Self-pay | Admitting: Family Medicine

## 2016-10-19 DIAGNOSIS — Z124 Encounter for screening for malignant neoplasm of cervix: Secondary | ICD-10-CM | POA: Insufficient documentation

## 2016-10-22 LAB — CYTOLOGY - PAP: DIAGNOSIS: NEGATIVE

## 2016-11-27 ENCOUNTER — Ambulatory Visit: Payer: BC Managed Care – PPO

## 2017-10-19 IMAGING — MR MR FOOT*L* W/O CM
4 of 6 series · 21 of 40 positions shown · non-contrast
Comparison: Radiographs 03/15/2016

CLINICAL DATA: Heel pain for 4 months.

EXAM:
MRI OF THE LEFT FOOT WITHOUT CONTRAST
TECHNIQUE: Multiplanar, multisequence MR imaging of the ankle was performed. No
intravenous contrast was administered.

[Series 3: PD fat-sat · axial · 3.5mm · 0.31mm/px · z∈[-55,+67]mm · 7 of 29 slices shown]
[im 1/29]
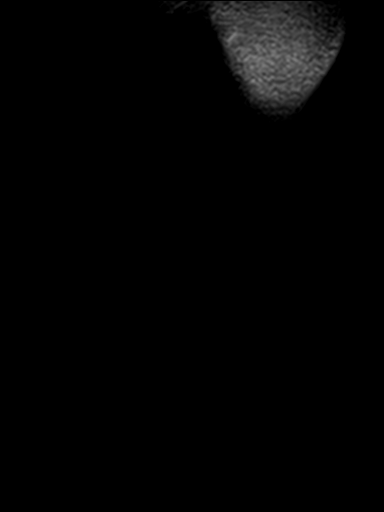
[im 5/29]
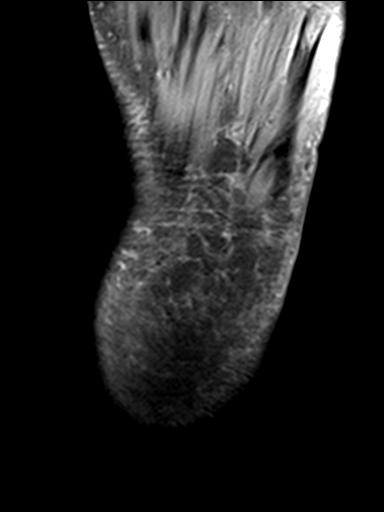
[im 10/29]
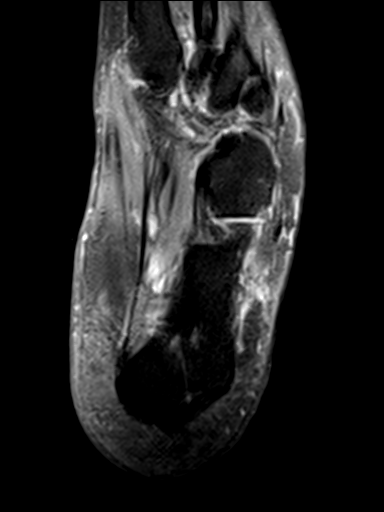
[im 15/29]
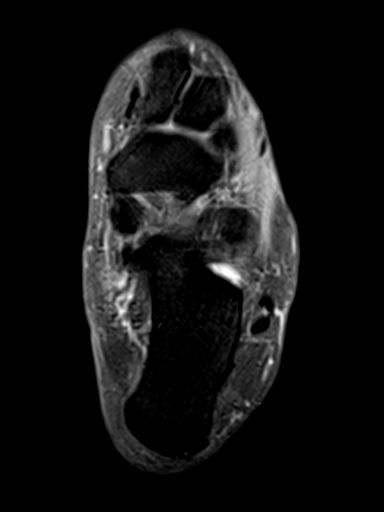
[im 19/29]
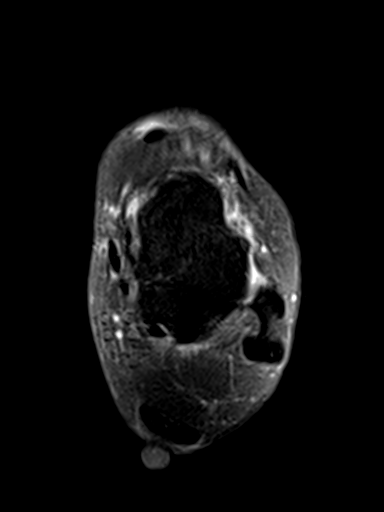
[im 24/29]
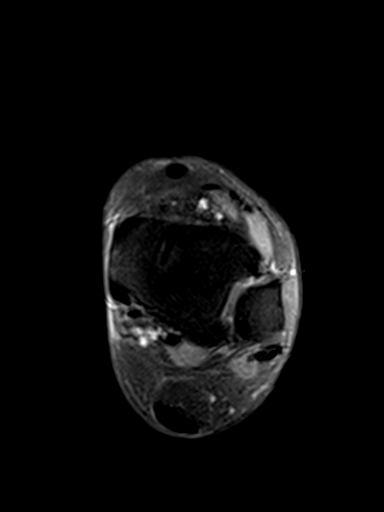
[im 29/29]
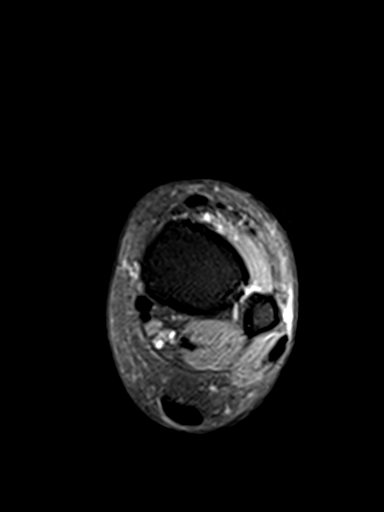

[Series 6: T2 fat-sat · axial · 3.5mm · 0.31mm/px · z∈[-55,+67]mm · 8 of 29 slices shown (1 of 3)]
[im 1/29]
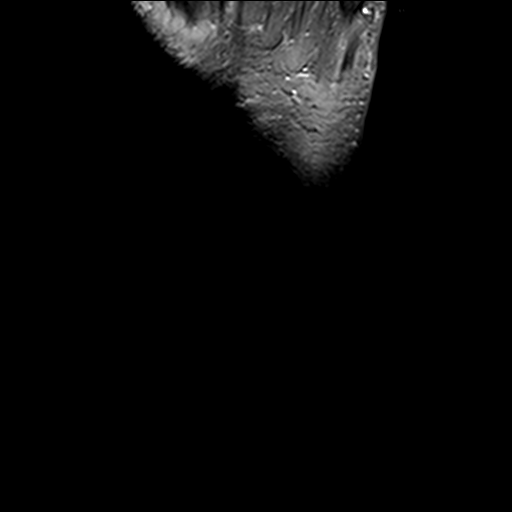
[im 5/29]
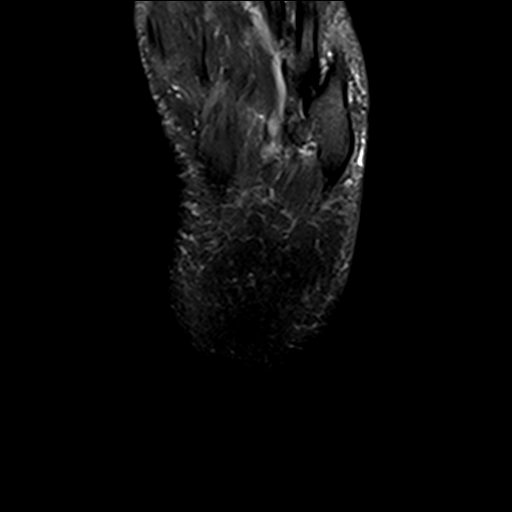
[im 9/29]
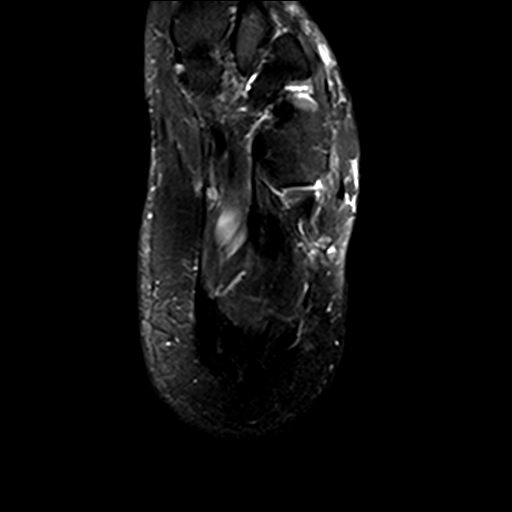
[im 13/29]
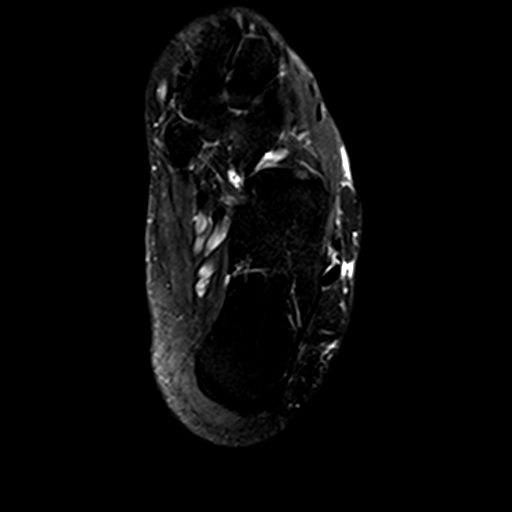
[im 17/29]
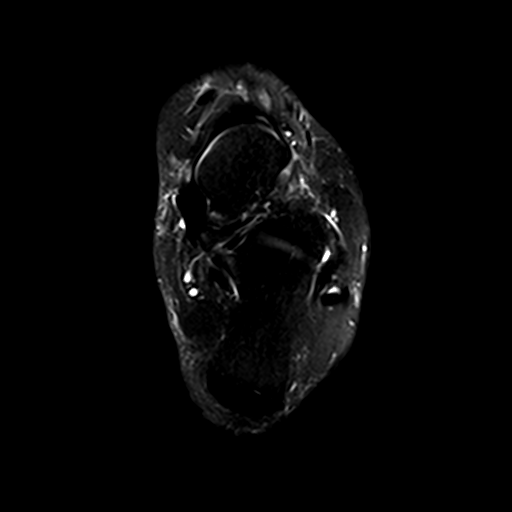
[im 21/29]
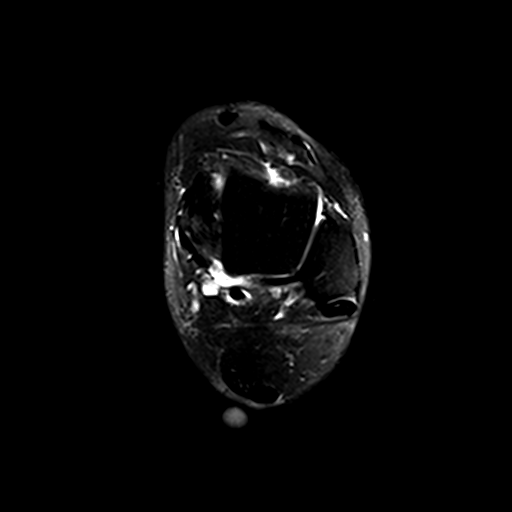
[im 25/29]
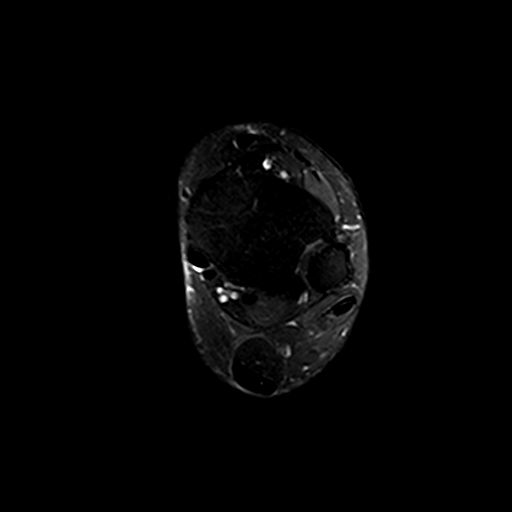
[im 29/29]
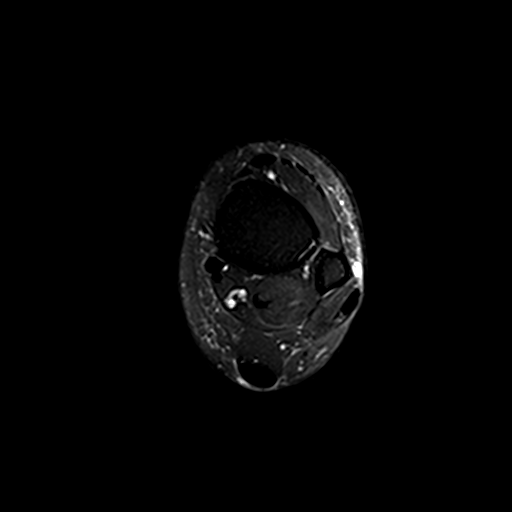

[Series 10: T2 fat-sat · coronal · 4.0mm · 0.33mm/px · 3 of 28 slices shown (2 of 3)]
[im 5/28]
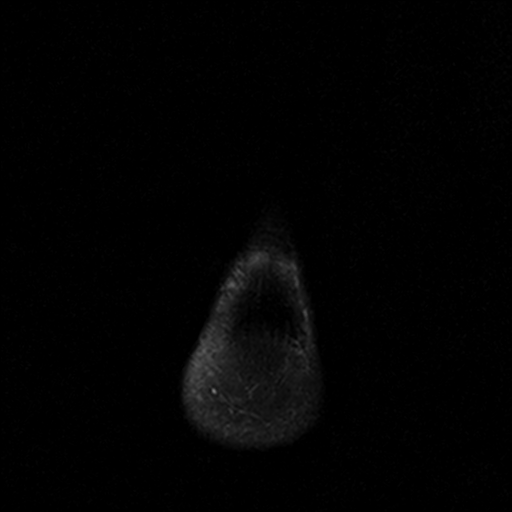
[im 14/28]
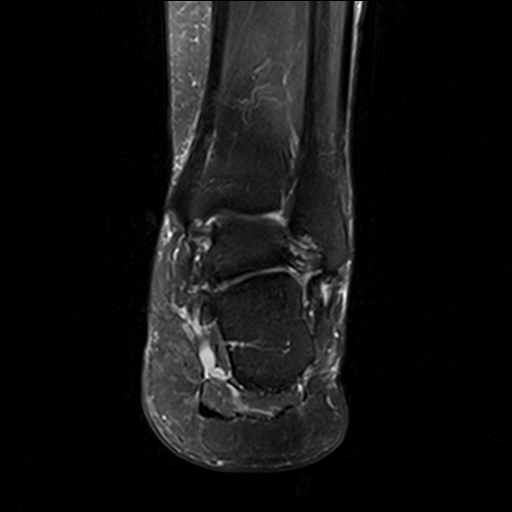
[im 23/28]
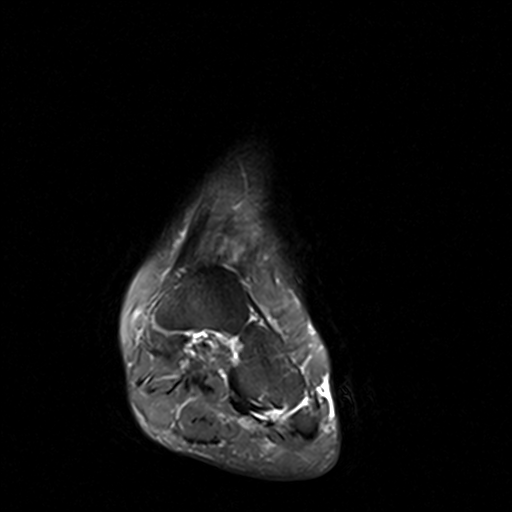

[Series 11: T2 fat-sat · sagittal · 3.0mm · 0.33mm/px · 3 of 22 slices shown (3 of 3)]
[im 5/22]
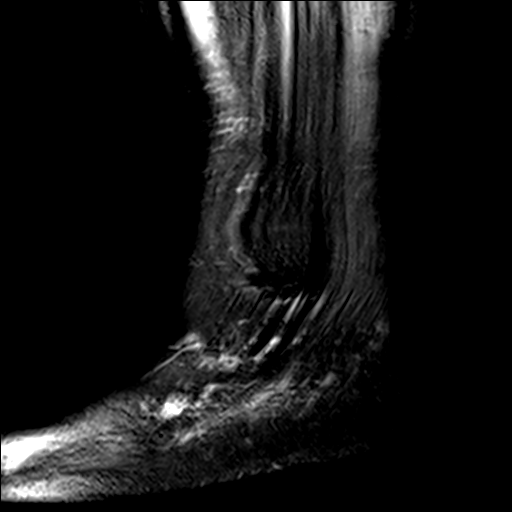
[im 13/22]
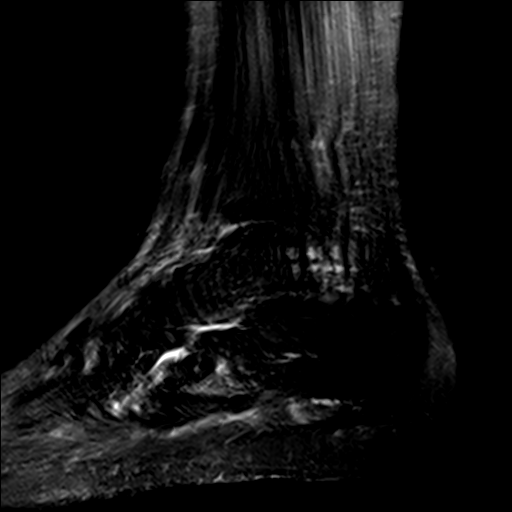
[im 22/22]
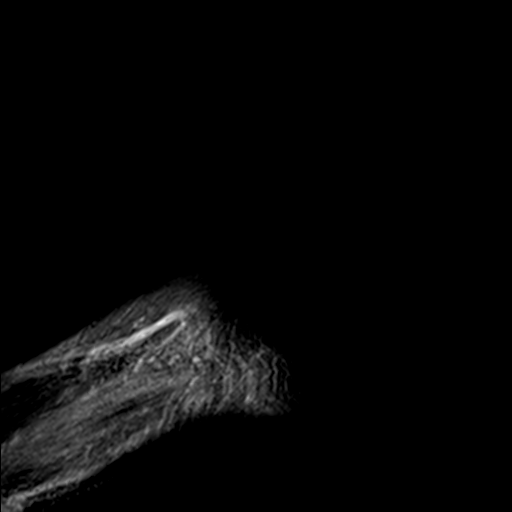

[21 of 40 positions shown; findings below may reference images not displayed]

FINDINGS: TENDONS

Peroneal: Tendinopathy and short segment longitudinal split type
tear involving the peroneus brevis tendon. There is also
tenosynovitis but no rupture. The peroneus longus tendon is intact.

Posteromedial: Intact. Mild tenosynovitis involving the posterior
tibialis tendon.

Anterior: Intact.

Achilles: Moderate focal tendinopathy involving the distal Achilles
tendon beginning approximately 18 mm above the attachment site.
Probable small interstitial tears but no rupture.

Plantar Fascia: Intact

LIGAMENTS

Lateral: Intact

Medial: Intact

CARTILAGE

Ankle Joint: No significant degenerative changes. No cartilage
defects or osteochondral lesion. No joint effusion.

Subtalar Joints/Sinus Tarsi: Minimal subtalar joint degenerative
changes but no joint effusion for cartilage defects. The sinus tarsi
is normal. The cervical and interosseous ligaments are intact. The
spring ligament is intact.

Bones: No stress fracture or osteochondral lesion. Mild pes planus.
Mild midfoot degenerative changes.

Other: The foot and ankle musculature is unremarkable.
IMPRESSION: 1. Moderate focal tendinopathy involving the distal Achilles tendon
without tear.
2. Short segment longitudinal split type tear, tendinopathy and
tenosynovitis involving the peroneus brevis tendon.
3. No stress fracture or osteochondral abnormality.

## 2018-05-30 DIAGNOSIS — K1120 Sialoadenitis, unspecified: Secondary | ICD-10-CM | POA: Insufficient documentation

## 2018-08-13 ENCOUNTER — Other Ambulatory Visit (HOSPITAL_COMMUNITY)
Admission: RE | Admit: 2018-08-13 | Discharge: 2018-08-13 | Disposition: A | Discharge status: Home / Self Care | Payer: BC Managed Care – PPO | Source: Ambulatory Visit | Attending: Obstetrics and Gynecology | Admitting: Obstetrics and Gynecology

## 2018-08-13 ENCOUNTER — Other Ambulatory Visit: Payer: Self-pay | Admitting: Obstetrics and Gynecology

## 2018-08-13 DIAGNOSIS — Z124 Encounter for screening for malignant neoplasm of cervix: Secondary | ICD-10-CM | POA: Insufficient documentation

## 2018-08-13 DIAGNOSIS — N950 Postmenopausal bleeding: Secondary | ICD-10-CM | POA: Diagnosis present

## 2018-08-15 LAB — CYTOLOGY - PAP
Diagnosis: NEGATIVE
HPV: NOT DETECTED

## 2018-08-26 ENCOUNTER — Other Ambulatory Visit: Payer: Self-pay | Admitting: Family Medicine

## 2018-08-26 DIAGNOSIS — Z1231 Encounter for screening mammogram for malignant neoplasm of breast: Secondary | ICD-10-CM

## 2018-10-13 ENCOUNTER — Ambulatory Visit
Admission: RE | Admit: 2018-10-13 | Discharge: 2018-10-13 | Disposition: A | Discharge status: Home / Self Care | Payer: BC Managed Care – PPO | Source: Ambulatory Visit | Attending: Family Medicine | Admitting: Family Medicine

## 2018-10-13 ENCOUNTER — Other Ambulatory Visit: Payer: Self-pay

## 2018-10-13 DIAGNOSIS — Z1231 Encounter for screening mammogram for malignant neoplasm of breast: Secondary | ICD-10-CM

## 2018-11-20 ENCOUNTER — Other Ambulatory Visit: Payer: Self-pay

## 2018-11-20 ENCOUNTER — Ambulatory Visit: Payer: BC Managed Care – PPO | Admitting: Podiatry

## 2018-11-20 ENCOUNTER — Encounter: Payer: Self-pay | Admitting: Podiatry

## 2018-11-20 ENCOUNTER — Ambulatory Visit (INDEPENDENT_AMBULATORY_CARE_PROVIDER_SITE_OTHER): Payer: BC Managed Care – PPO

## 2018-11-20 ENCOUNTER — Other Ambulatory Visit: Payer: Self-pay | Admitting: Podiatry

## 2018-11-20 DIAGNOSIS — M722 Plantar fascial fibromatosis: Secondary | ICD-10-CM

## 2018-11-20 DIAGNOSIS — M7672 Peroneal tendinitis, left leg: Secondary | ICD-10-CM

## 2018-11-20 DIAGNOSIS — M7662 Achilles tendinitis, left leg: Secondary | ICD-10-CM

## 2018-11-20 MED ORDER — METHYLPREDNISOLONE 4 MG PO TBPK: ORAL_TABLET | ORAL | 0 refills | Status: DC

## 2018-11-20 MED ORDER — MELOXICAM 15 MG PO TABS: 15.0000 mg | ORAL_TABLET | Freq: Every day | ORAL | 3 refills | Status: DC

## 2018-11-20 NOTE — Patient Instructions (Signed)

## 2018-11-22 ENCOUNTER — Encounter: Payer: Self-pay | Admitting: Podiatry

## 2018-11-22 NOTE — Progress Notes (Signed)
She presents today chief complaint of pain to the left lateral ankle states that is starting to swell and become painful again is been aching and swelling for the past several months intermittently we previously performed surgery along the peroneal secondary to the peroneal tendon tear at that point she denies any trauma and states that she has been taking Tylenol which really has not helped much at all.  We also performed an Achilles tendon repair at that time.  She is very happy with the outcome of all of that except for now the peroneals are hurting right here as she points to the proximal portion of the incision site.  PMH: I have reviewed her past medical history medications allergies surgeries and social history.  ROS: Denies fever chills nausea vomiting muscle aches pains calf pain back pain chest pain shortness of breath and headaches.  Objective: Vital signs are stable alert and oriented x3.  Pulses are palpable.  Capillary fill time is immediate neurologic sensorium is intact deep tendon reflexes are intact.  Muscle strength is normal and symmetrical though she does have pain on palpation of the peroneal tendons with what appears to be fluctuance within the tendon sheath.  She also has pain on palpation medial calcaneal tubercle of the left heel cutaneous evaluation demonstrates supple well-hydrated cutis no open lesions or wounds.  Radiographs taken today demonstrate an osseously mature individual no acute findings.  There is some soft tissue swelling over the lateral aspect of the ankle and lateral foot.  Assessment: Plantar fasciitis left with lateral compensatory syndrome and peroneal tendinitis.  Plan: After sterile Betadine skin prep I injected 20 mg Kenalog 5 mg Marcaine to the plantar fascia of the left heel.  Placed her on a Medrol Dosepak to be followed by meloxicam and placed in a plantar fascial brace hopefully this will alleviate her symptoms in the lateral compartment as well.  I  will follow-up with her in about 1 month we discussed appropriate shoe gear stretching exercises ice therapy and shoe gear modifications.

## 2018-12-18 ENCOUNTER — Ambulatory Visit (INDEPENDENT_AMBULATORY_CARE_PROVIDER_SITE_OTHER): Payer: BC Managed Care – PPO | Admitting: Podiatry

## 2018-12-18 ENCOUNTER — Encounter: Payer: Self-pay | Admitting: Podiatry

## 2018-12-18 ENCOUNTER — Other Ambulatory Visit: Payer: Self-pay

## 2018-12-18 DIAGNOSIS — M7672 Peroneal tendinitis, left leg: Secondary | ICD-10-CM | POA: Diagnosis not present

## 2018-12-18 DIAGNOSIS — M722 Plantar fascial fibromatosis: Secondary | ICD-10-CM

## 2018-12-18 NOTE — Progress Notes (Signed)
She presents today for follow-up of plantar fasciitis and peroneal tendinitis of her left foot states that is still hurting in the ankle laterally and in the heel plantarly.  All in all she states that may have improved by about 10% but not very much at all.  Objective: Vital signs are stable she is alert and oriented x3.  Pulses are palpable.  She still has pain on palpation of the peroneals near the top end of her previous incision I am able to palpate an area of firmness there as well as some fluid within the tendon sheath.  I am also able to palpate pain on palpation medial calcaneal tubercle of the left heel.  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  Plan: Discussed etiology pathology conservative surgical therapies went ahead and injected the peroneal tendons and 10 mg of Kenalog 5 mg Marcaine went ahead and injected the plantar heel with 20 mg Kenalog 5 mg Marcaine point maximal tenderness.  Tolerated procedure well.  If not improved by next visit I will get an MRI I also put her in a short cam walker that I recommend she wear at all times.

## 2019-01-22 ENCOUNTER — Ambulatory Visit: Payer: BC Managed Care – PPO | Admitting: Podiatry

## 2019-02-03 ENCOUNTER — Ambulatory Visit: Payer: BC Managed Care – PPO | Admitting: Podiatry

## 2019-02-26 ENCOUNTER — Ambulatory Visit: Payer: BC Managed Care – PPO | Admitting: Podiatry

## 2019-02-26 ENCOUNTER — Other Ambulatory Visit: Payer: Self-pay

## 2019-02-26 DIAGNOSIS — M7672 Peroneal tendinitis, left leg: Secondary | ICD-10-CM | POA: Diagnosis not present

## 2019-02-28 NOTE — Progress Notes (Signed)
She presents today states that the pain is 0 with the boot however without the boot and is exquisitely painful.  As she refers to the intractable plantar fasciitis and peroneal tendons and the Achilles which we have already performed surgery on.  Objective: Vital signs are stable she is alert and oriented x3.  Pulses are palpable.  Pain on palpation to the site search is exquisitely tender particular the plantar fascia.  Assessment: Pain in limb secondary to possible tear of the peroneal tendon Achilles tendon and the plantar fascia.  Plan: We will request MRI of the rear foot left ankle without contrast for evaluation surgical.

## 2019-03-17 ENCOUNTER — Telehealth: Payer: Self-pay | Admitting: *Deleted

## 2019-03-17 NOTE — Telephone Encounter (Signed)
-----   Message from Sandre Kitty sent at 03/17/2019  2:32 PM EST ----- Regarding: Prior Authorization Please obtain authorization for this patient's scheduled exam:  [id number]  DOS: 03/24/2019 CPT:    A889354 (MR Ankle)  SITE:    [address]  [phone]   Tax ID: [id number]  Thank you, Sandre Kitty

## 2019-03-18 NOTE — Telephone Encounter (Signed)
Request Status:  Authorized  Health Plan: [employer]   Order ID: [id number]  Valid Dates: 03/18/2019 - 09/13/2019  [address] [place]

## 2019-03-18 NOTE — Telephone Encounter (Signed)
Faxed orders, and authorization to [facility].

## 2019-03-24 ENCOUNTER — Ambulatory Visit
Admission: RE | Admit: 2019-03-24 | Discharge: 2019-03-24 | Disposition: A | Discharge status: Home / Self Care | Payer: BC Managed Care – PPO | Source: Ambulatory Visit | Attending: Podiatry | Admitting: Podiatry

## 2019-03-24 ENCOUNTER — Other Ambulatory Visit: Payer: Self-pay

## 2019-03-25 ENCOUNTER — Telehealth: Payer: Self-pay

## 2019-03-25 ENCOUNTER — Encounter: Payer: Self-pay | Admitting: *Deleted

## 2019-03-25 NOTE — Telephone Encounter (Signed)
Brandy Harris can let her know that the findings are very nonspecific and we are sending the MRI out for an over read by different radiologist.

## 2019-03-25 NOTE — Telephone Encounter (Signed)
Patient called to report that her MRI results have posted in [place]. She has read over the report and doesn't understand it. She is asking for a call to explain. Thanks [facility]

## 2019-03-26 NOTE — Telephone Encounter (Signed)
Patient was informed of this yesterday

## 2019-03-31 ENCOUNTER — Telehealth: Payer: Self-pay | Admitting: *Deleted

## 2019-03-31 NOTE — Telephone Encounter (Signed)
Received notification from [facility] that the copy of the MRI Disc was mailed 03/25/2019.Copy of MRI mailed to [facility].

## 2019-04-06 ENCOUNTER — Encounter: Payer: Self-pay | Admitting: Podiatry

## 2019-04-17 ENCOUNTER — Encounter: Payer: Self-pay | Admitting: Podiatry

## 2019-04-20 NOTE — Telephone Encounter (Signed)
[facility] - Eduard Clos states she will fax the report.

## 2019-04-21 ENCOUNTER — Telehealth: Payer: Self-pay | Admitting: *Deleted

## 2019-04-21 NOTE — Telephone Encounter (Signed)
Dr. Milinda Pointer reviewed MRI overread and requested pt make an appt. I spoke with pt and transferred to schedulers.

## 2019-04-28 ENCOUNTER — Telehealth: Payer: Self-pay | Admitting: Podiatry

## 2019-04-28 ENCOUNTER — Other Ambulatory Visit: Payer: Self-pay

## 2019-04-28 ENCOUNTER — Encounter: Payer: Self-pay | Admitting: Podiatry

## 2019-04-28 ENCOUNTER — Ambulatory Visit: Payer: BC Managed Care – PPO | Admitting: Podiatry

## 2019-04-28 DIAGNOSIS — M722 Plantar fascial fibromatosis: Secondary | ICD-10-CM | POA: Diagnosis not present

## 2019-04-28 DIAGNOSIS — M7672 Peroneal tendinitis, left leg: Secondary | ICD-10-CM | POA: Diagnosis not present

## 2019-04-28 NOTE — Progress Notes (Signed)
Brandy Harris presents today for surgical consult regarding her left foot and ankle.  States that the Achilles is doing pretty good however the peroneal is just killing me.  She states that the plantar fasciitis is not as severe as it was.  Objective: Pulses are palpable left.  She has considered mild swelling to the posterior inferior aspect of the left fibula posterior to the lateral malleolus.  She has tenderness on palpation to this area MRI is consistent with a tear of the peroneus brevis tendon in the posterior aspect of the lateral malleolus as opposed to the peroneus longus which would had a total tear before that we repaired.  Assessment: Residual plantar fasciitis and peroneal tendon tear.  Plan: Consented her today for primary repair of the peroneus brevis tendon left and a total endoscopic plantar fasciotomy left.  I answered all the questions regarding this procedure the best my billing layman's terms she understood was amenable to it understands that she will be in a cast for up to 4 weeks.  She will then be walking with the cam walker for up to 4 more weeks.  I will follow-up with her in the near future for surgical intervention she understands all of this possible side effects and complications are associated with this type of surgery and we will do this at the surgery center in [facility] she was provided with both oral and written home-going instructions for the morning of surgery as well as information regarding the surgery center and anesthesia group.  We also dispensed a new Cam walker today.

## 2019-04-28 NOTE — Telephone Encounter (Signed)
DOS: 05/08/2019  SURGICAL PROCEDURES: Endoscopic Plantar Fasciotomy [id number]), Repair Peroneus Brevis Tendon [id number]), and Cast Application Left.  BCBS Policy Effective : [id number]  -  03/25/9998  Member Liability Summary       In-Network   Max Per Benefit Period Year-to-Date Remaining     CoInsurance 30%      Deductible $1500.00 $1500.00     Out-Of-Pocket $5900.00 $5779.58  [facility] AM[facility] SUR[facility]ERY      In Network Copay Coinsurance Not Applicable [id number]  per  [place]

## 2019-04-28 NOTE — Patient Instructions (Signed)
Pre-Operative Instructions  Congratulations, you have decided to take an important step towards improving your quality of life.  You can be assured that the doctors and staff at [facility] will be with you every step of the way.  Here are some important things you should know:  1. Plan to be at the surgery center/hospital at least 1 (one) hour prior to your scheduled time, unless otherwise directed by the surgical center/hospital staff.  You must have a responsible adult accompany you, remain during the surgery and drive you home.  Make sure you have directions to the surgical center/hospital to ensure you arrive on time. 2. If you are having surgery at [facility] or [facility], you will need a copy of your medical history and physical form from your family physician within one month prior to the date of surgery. We will give you a form for your primary physician to complete.  3. We make every effort to accommodate the date you request for surgery.  However, there are times where surgery dates or times have to be moved.  We will contact you as soon as possible if a change in schedule is required.   4. No aspirin/ibuprofen for one week before surgery.  If you are on aspirin, any non-steroidal anti-inflammatory medications (Mobic, Aleve, Ibuprofen) should not be taken seven (7) days prior to your surgery.  You make take Tylenol for pain prior to surgery.  5. Medications - If you are taking daily heart and blood pressure medications, seizure, reflux, allergy, asthma, anxiety, pain or diabetes medications, make sure you notify the surgery center/hospital before the day of surgery so they can tell you which medications you should take or avoid the day of surgery. 6. No food or drink after midnight the night before surgery unless directed otherwise by surgical center/hospital staff. 7. No alcoholic beverages 24-hours prior to surgery.  No smoking 24-hours prior or 24-hours after  surgery. 8. Wear loose pants or shorts. They should be loose enough to fit over bandages, boots, and casts. 9. Don't wear slip-on shoes. Sneakers are preferred. 10. Bring your boot with you to the surgery center/hospital.  Also bring crutches or a walker if your physician has prescribed it for you.  If you do not have this equipment, it will be provided for you after surgery. 11. If you have not been contacted by the surgery center/hospital by the day before your surgery, call to confirm the date and time of your surgery. 12. Leave-time from work may vary depending on the type of surgery you have.  Appropriate arrangements should be made prior to surgery with your employer. 13. Prescriptions will be provided immediately following surgery by your doctor.  Fill these as soon as possible after surgery and take the medication as directed. Pain medications will not be refilled on weekends and must be approved by the doctor. 14. Remove nail polish on the operative foot and avoid getting pedicures prior to surgery. 15. Wash the night before surgery.  The night before surgery wash the foot and leg well with water and the antibacterial soap provided. Be sure to pay special attention to beneath the toenails and in between the toes.  Wash for at least three (3) minutes. Rinse thoroughly with water and dry well with a towel.  Perform this wash unless told not to do so by your physician.  Enclosed: 1 Ice pack (please put in freezer the night before surgery)   1 Hibiclens skin cleaner     Pre-op instructions  If you have any questions regarding the instructions, please do not hesitate to call our office.  [facility]: [address], [place], [place] [address] -- [phone]  [facility]: [address] [place], [place] [address] -- [phone]  [facility]: [address], , [place] [address] -- [phone]   Website: [email or web address] 

## 2019-05-01 ENCOUNTER — Encounter: Payer: Self-pay | Admitting: Podiatry

## 2019-05-06 ENCOUNTER — Other Ambulatory Visit: Payer: Self-pay | Admitting: Podiatry

## 2019-05-06 MED ORDER — ONDANSETRON HCL 4 MG PO TABS: 4.0000 mg | ORAL_TABLET | Freq: Three times a day (TID) | ORAL | 0 refills | Status: DC | PRN

## 2019-05-06 MED ORDER — CLINDAMYCIN HCL 150 MG PO CAPS: 150.0000 mg | ORAL_CAPSULE | Freq: Three times a day (TID) | ORAL | 0 refills | Status: DC

## 2019-05-06 MED ORDER — OXYCODONE-ACETAMINOPHEN 10-325 MG PO TABS: 1.0000 | ORAL_TABLET | Freq: Three times a day (TID) | ORAL | 0 refills | Status: AC | PRN

## 2019-05-08 ENCOUNTER — Encounter: Payer: Self-pay | Admitting: Podiatry

## 2019-05-08 DIAGNOSIS — M7672 Peroneal tendinitis, left leg: Secondary | ICD-10-CM

## 2019-05-08 DIAGNOSIS — M722 Plantar fascial fibromatosis: Secondary | ICD-10-CM

## 2019-05-14 ENCOUNTER — Encounter: Payer: BC Managed Care – PPO | Admitting: Podiatry

## 2019-05-15 ENCOUNTER — Encounter: Payer: Self-pay | Admitting: Podiatry

## 2019-05-15 ENCOUNTER — Ambulatory Visit (INDEPENDENT_AMBULATORY_CARE_PROVIDER_SITE_OTHER): Payer: BC Managed Care – PPO | Admitting: Podiatry

## 2019-05-15 ENCOUNTER — Other Ambulatory Visit: Payer: Self-pay

## 2019-05-15 DIAGNOSIS — M7672 Peroneal tendinitis, left leg: Secondary | ICD-10-CM

## 2019-05-15 DIAGNOSIS — M722 Plantar fascial fibromatosis: Secondary | ICD-10-CM

## 2019-05-15 NOTE — Progress Notes (Signed)
Subjective:  Patient ID: Brandy Harris, female    DOB: Nov 06, 1960,  MRN: [id number]  Chief Complaint  Patient presents with  . Routine Post Op    pov#1 dos 02.12.2021 EPF Lt, Repair Peroneus Brevis Tendon Lt, Cast Application Lt, cast is still intact, pt states she has no nerve pain, pain is a 0 out of 10 on the pain scale.    59 y.o. female returns for post-op check.  Patient presents after undergoing the above procedure.  She states she is doing really well.  She does not have any acute complaints.  She her motor or sensory functions are intact.  Surgery was done by Dr. Milinda Pointer.  Patient scheduled to follow-up with him afterwards.  The cast is clean dry and intact.  She has been nonweightbearing with a knee scooter  Review of Systems: Negative except as noted in the HPI. Denies N/V/F/Ch.  Past Medical History:  Diagnosis Date  . Difficult intubation   . Hypertension   . Meniere disease   . Thyroid disease     Current Outpatient Medications:  .  azelastine (ASTELIN) 137 MCG/SPRAY nasal spray, Place 1 spray into the nose 2 (two) times daily. Use in each nostril as directed, Disp: , Rfl:  .  clindamycin (CLEOCIN) 150 MG capsule, Take 1 capsule (150 mg total) by mouth 3 (three) times daily., Disp: 30 capsule, Rfl: 0 .  diazepam (VALIUM) 2 MG tablet, Take by mouth., Disp: , Rfl:  .  diazepam (VALIUM) 5 MG tablet, Take 2.5 mg by mouth daily as needed., Disp: , Rfl:  .  levothyroxine (SYNTHROID, LEVOTHROID) 137 MCG tablet, Take 137 mcg by mouth daily before breakfast., Disp: , Rfl:  .  mometasone (NASONEX) 50 MCG/ACT nasal spray, Place 2 sprays into the nose daily., Disp: , Rfl:  .  ondansetron (ZOFRAN) 4 MG tablet, Take 1 tablet (4 mg total) by mouth every 8 (eight) hours as needed., Disp: 20 tablet, Rfl: 0 .  triamterene-hydrochlorothiazide (MAXZIDE) 75-50 MG tablet, Take 1 tablet by mouth every morning., Disp: , Rfl: 0 .  Vitamin D, Ergocalciferol, (DRISDOL) 50000 units CAPS capsule, Take  by mouth., Disp: , Rfl:  .  meloxicam (MOBIC) 15 MG tablet, Take 1 tablet (15 mg total) by mouth daily., Disp: 30 tablet, Rfl: 3  Social History   Tobacco Use  Smoking Status Never Smoker  Smokeless Tobacco Never Used    Allergies  Allergen Reactions  . Cephalexin Other (See Comments)    Pt stated, "Gave me blisters in my mouth"  . Lisinopril Swelling  . Sulfa Antibiotics    Objective:  There were no vitals filed for this visit. There is no height or weight on file to calculate BMI. Constitutional Well developed. Well nourished.  Vascular Foot warm and well perfused. Capillary refill normal to all digits.   Neurologic Normal speech. Oriented to person, place, and time. Epicritic sensation to light touch grossly present bilaterally.  Dermatologic  cast was not removed during this clinical visit.  No calf pain  Orthopedic:  Motor or sensory functions are intact.  Cast is intact.   Radiographs: None Assessment:   1. Peroneal tendinitis of left lower extremity   2. Plantar fasciitis, left    Plan:  Patient was evaluated and treated and all questions answered.  S/p foot surgery left -Progressing as expected post-operatively. -XR: None -WB Status: Nonweightbearing in cast with a knee scooter -Sutures: Was not evaluated because the cast was not removed.  Cast will be removed  when the stitches are ready to come out.  Given that the cast is not giving her any discomfort, no indication for removal at this time. -Medications: None -Foot redressed.  No follow-ups on file.

## 2019-05-21 ENCOUNTER — Encounter: Payer: Self-pay | Admitting: Podiatry

## 2019-05-21 ENCOUNTER — Ambulatory Visit (INDEPENDENT_AMBULATORY_CARE_PROVIDER_SITE_OTHER): Payer: BC Managed Care – PPO | Admitting: Podiatry

## 2019-05-21 ENCOUNTER — Other Ambulatory Visit: Payer: Self-pay

## 2019-05-21 DIAGNOSIS — Z9889 Other specified postprocedural states: Secondary | ICD-10-CM

## 2019-05-21 DIAGNOSIS — M7672 Peroneal tendinitis, left leg: Secondary | ICD-10-CM

## 2019-05-21 DIAGNOSIS — M722 Plantar fascial fibromatosis: Secondary | ICD-10-CM

## 2019-05-21 NOTE — Progress Notes (Signed)
She presents today for postop visit #2 date of surgery May 08, 2019 status post EPF left and repair of peroneus brevis tendon and cast application.  She denies fever chills nausea vomiting muscle aches pains calf pain back pain chest pain shortness of breath.  Objective: Presents today utilizing the scooter with cast intact clean and dry.  Once removed demonstrates dry sterile compressive dressing was removed demonstrates no erythema it is moderate edema no cellulitis drainage or odor.  Plantar fasciotomy sutures were removed.  Moderate edema around the surgical site but no erythema suggesting any type of infection or inflammation.  Assessment: Well-healing surgical foot left.  Plan: Placed her in another dressed a compressive dressing but the sutures and along the lateral incision and put her in another below-knee cast.  She will continue nonweightbearing keep it dry and clean follow-up with me in 2 weeks for cast removal and suture removal.  At that time we will place lidocaine gel on the incision site let it sit for a few minutes and then we will remove the sutures with a #11 blade.

## 2019-06-04 ENCOUNTER — Encounter: Payer: Self-pay | Admitting: Podiatry

## 2019-06-04 ENCOUNTER — Other Ambulatory Visit: Payer: Self-pay

## 2019-06-04 ENCOUNTER — Ambulatory Visit (INDEPENDENT_AMBULATORY_CARE_PROVIDER_SITE_OTHER): Payer: BC Managed Care – PPO | Admitting: Podiatry

## 2019-06-04 DIAGNOSIS — M7672 Peroneal tendinitis, left leg: Secondary | ICD-10-CM

## 2019-06-04 DIAGNOSIS — Z9889 Other specified postprocedural states: Secondary | ICD-10-CM

## 2019-06-04 DIAGNOSIS — M722 Plantar fascial fibromatosis: Secondary | ICD-10-CM

## 2019-06-04 NOTE — Progress Notes (Signed)
She presents today for postop visit date of surgery May 08, 2019 status post EPF and repair of peroneus brevis left.  She presents for suture removal and cast removal.  She states that is been feeling okay.  Objective: Vital signs are stable she is alert and oriented x3 there is moderate edema overlying the perineal incision site but the endoscopic plantar fasciotomy suture sites have gone on to heal uneventfully with no problems.  Minimal edema.  No pain.  She has good range of motion of the ankle joint.  No open lesions or wounds margins are well coapted after the sutures were removed.  Assessment: Well-healing surgical foot and ankle left.  Plan: At this point I am going go ahead and put her in a compression anklet and her cam walker.  She is will start partial weightbearing I will follow-up with her in 2 weeks.  She can wash this and she is to sleep in her night splint.

## 2019-06-18 ENCOUNTER — Ambulatory Visit (INDEPENDENT_AMBULATORY_CARE_PROVIDER_SITE_OTHER): Payer: BC Managed Care – PPO | Admitting: Podiatry

## 2019-06-18 ENCOUNTER — Other Ambulatory Visit: Payer: Self-pay

## 2019-06-18 ENCOUNTER — Encounter: Payer: Self-pay | Admitting: Podiatry

## 2019-06-18 DIAGNOSIS — Z9889 Other specified postprocedural states: Secondary | ICD-10-CM

## 2019-06-18 DIAGNOSIS — M7672 Peroneal tendinitis, left leg: Secondary | ICD-10-CM

## 2019-06-18 DIAGNOSIS — M722 Plantar fascial fibromatosis: Secondary | ICD-10-CM

## 2019-06-18 NOTE — Progress Notes (Signed)
She presents today for follow-up of her surgery date of surgery May 08, 2019 status post EPF and peroneus brevis tendon repair and cast application.  She states that she is really doing pretty well not a whole lot of swelling he is able to move the foot continues to utilize her cam walker and her crutches.  Objective: Vital signs are stable she alert oriented x3 presents today nonweightbearing with Cam walker.  Removed once reveals demonstrates pressure anklet on minimal edema no erythema cellulitis drainage or odor incision sites are gone on to heal uneventfully she has mild tenderness on palpation of the peroneal site however she does not have any pain on palpation of the endoscopic plantar fasciotomy site.  Assessment: Well-healing surgical foot ankle.  Plan: I will follow-up with her in about 3 weeks at which time if we need to go to physical therapy to help break up any scar tissue we will go ahead and get started on that.  Since we did not do it on previous surgery I feel is probably necessary to go on this 1.

## 2019-07-09 ENCOUNTER — Ambulatory Visit (INDEPENDENT_AMBULATORY_CARE_PROVIDER_SITE_OTHER): Payer: BC Managed Care – PPO | Admitting: Podiatry

## 2019-07-09 ENCOUNTER — Encounter: Payer: Self-pay | Admitting: Podiatry

## 2019-07-09 ENCOUNTER — Other Ambulatory Visit: Payer: Self-pay

## 2019-07-09 DIAGNOSIS — M7672 Peroneal tendinitis, left leg: Secondary | ICD-10-CM | POA: Diagnosis not present

## 2019-07-09 DIAGNOSIS — M722 Plantar fascial fibromatosis: Secondary | ICD-10-CM

## 2019-07-09 DIAGNOSIS — Z9889 Other specified postprocedural states: Secondary | ICD-10-CM

## 2019-07-09 NOTE — Progress Notes (Signed)
She presents today status post peroneal tendon repair and EPF left.  She denies any problems date of surgery was May 08, 2019 states that is doing just perfect.  Objective: Vital signs stable alert and x3 presents in her cam boot today.  No pain on palpation of the surgical site moderate edema is present.  Assessment: Edema status post peroneal tendon repair left.  Plan: At this point I recommended get back into a Tri-Lock brace with a tennis shoe alternating with her cam boot walker follow-up with her in 1 month.

## 2019-08-20 ENCOUNTER — Encounter: Payer: Self-pay | Admitting: Podiatry

## 2019-08-20 ENCOUNTER — Other Ambulatory Visit: Payer: Self-pay

## 2019-08-20 ENCOUNTER — Ambulatory Visit (INDEPENDENT_AMBULATORY_CARE_PROVIDER_SITE_OTHER): Payer: BC Managed Care – PPO | Admitting: Podiatry

## 2019-08-20 DIAGNOSIS — M722 Plantar fascial fibromatosis: Secondary | ICD-10-CM

## 2019-08-20 DIAGNOSIS — Z9889 Other specified postprocedural states: Secondary | ICD-10-CM

## 2019-08-20 DIAGNOSIS — M7672 Peroneal tendinitis, left leg: Secondary | ICD-10-CM

## 2019-08-20 NOTE — Progress Notes (Signed)
She presents today date of surgery May 08, 2019 status post total endoscopic plantar fasciotomy left foot and repair of peroneus brevis tendon with cast application.  Currently she states that she is doing very well she feels that were going on have to part ways because she is doing so much better she is very happy with the outcome thus far states that she still has good and bad days but more good than bad.  She denies fever chills nausea vomiting muscle aches pains calf pain back pain chest pain shortness of breath she does relate that the Tri-Lock brace does bother the plantar fasciotomy sites.  Objective: Vital signs are stable alert and oriented x3.  Pulses are palpable.  There is no erythema cellulitis drainage or odor still has considerable edema to the lateral aspect of the foot but muscle strength is normal symmetrical bilateral.  She still has some tenderness the distalmost aspect of the incision site along the lateral border most likely scar tissue encompassing a portion of the sural nerve distribution.  Assessment: Well-healing surgical foot and ankle.  Plan: Encouraged regular activities and will follow up with me on an as-needed basis.

## 2020-03-08 ENCOUNTER — Other Ambulatory Visit: Payer: Self-pay | Admitting: Family Medicine

## 2020-03-08 DIAGNOSIS — R7401 Elevation of levels of liver transaminase levels: Secondary | ICD-10-CM

## 2020-03-30 ENCOUNTER — Ambulatory Visit
Admission: RE | Admit: 2020-03-30 | Discharge: 2020-03-30 | Disposition: A | Discharge status: Home / Self Care | Payer: BC Managed Care – PPO | Source: Ambulatory Visit | Attending: Family Medicine | Admitting: Family Medicine

## 2020-03-30 DIAGNOSIS — R7401 Elevation of levels of liver transaminase levels: Secondary | ICD-10-CM

## 2020-06-17 ENCOUNTER — Other Ambulatory Visit: Payer: Self-pay | Admitting: Family Medicine

## 2020-06-17 DIAGNOSIS — Z1231 Encounter for screening mammogram for malignant neoplasm of breast: Secondary | ICD-10-CM

## 2020-07-22 ENCOUNTER — Ambulatory Visit
Admission: RE | Admit: 2020-07-22 | Discharge: 2020-07-22 | Disposition: A | Discharge status: Home / Self Care | Payer: BC Managed Care – PPO | Source: Ambulatory Visit

## 2020-07-22 ENCOUNTER — Other Ambulatory Visit: Payer: Self-pay

## 2020-07-22 DIAGNOSIS — Z1231 Encounter for screening mammogram for malignant neoplasm of breast: Secondary | ICD-10-CM

## 2020-08-09 ENCOUNTER — Other Ambulatory Visit: Payer: Self-pay | Admitting: Family Medicine

## 2020-08-09 DIAGNOSIS — E278 Other specified disorders of adrenal gland: Secondary | ICD-10-CM

## 2020-08-23 ENCOUNTER — Other Ambulatory Visit: Payer: Self-pay

## 2020-08-23 ENCOUNTER — Ambulatory Visit
Admission: RE | Admit: 2020-08-23 | Discharge: 2020-08-23 | Disposition: A | Discharge status: Home / Self Care | Payer: BC Managed Care – PPO | Source: Ambulatory Visit | Attending: Family Medicine | Admitting: Family Medicine

## 2020-08-23 DIAGNOSIS — E278 Other specified disorders of adrenal gland: Secondary | ICD-10-CM

## 2021-08-14 ENCOUNTER — Other Ambulatory Visit: Payer: Self-pay | Admitting: Family Medicine

## 2021-08-28 ENCOUNTER — Other Ambulatory Visit: Payer: Self-pay | Admitting: Family Medicine

## 2021-08-28 DIAGNOSIS — Z1231 Encounter for screening mammogram for malignant neoplasm of breast: Secondary | ICD-10-CM

## 2021-08-30 ENCOUNTER — Ambulatory Visit
Admission: RE | Admit: 2021-08-30 | Discharge: 2021-08-30 | Disposition: A | Discharge status: Home / Self Care | Payer: BC Managed Care – PPO | Source: Ambulatory Visit

## 2021-08-30 DIAGNOSIS — Z1231 Encounter for screening mammogram for malignant neoplasm of breast: Secondary | ICD-10-CM

## 2021-09-05 ENCOUNTER — Other Ambulatory Visit: Payer: Self-pay | Admitting: Family Medicine

## 2021-09-05 DIAGNOSIS — E278 Other specified disorders of adrenal gland: Secondary | ICD-10-CM

## 2021-09-07 ENCOUNTER — Ambulatory Visit: Payer: BC Managed Care – PPO | Admitting: Podiatry

## 2021-09-21 ENCOUNTER — Ambulatory Visit: Payer: BC Managed Care – PPO | Admitting: Podiatry

## 2021-10-05 ENCOUNTER — Other Ambulatory Visit: Payer: Self-pay | Admitting: Family Medicine

## 2021-10-05 ENCOUNTER — Ambulatory Visit
Admission: RE | Admit: 2021-10-05 | Discharge: 2021-10-05 | Disposition: A | Discharge status: Home / Self Care | Payer: BC Managed Care – PPO | Source: Ambulatory Visit | Attending: Family Medicine | Admitting: Family Medicine

## 2021-10-05 DIAGNOSIS — E278 Other specified disorders of adrenal gland: Secondary | ICD-10-CM

## 2021-10-05 MED ORDER — IOPAMIDOL (ISOVUE-300) INJECTION 61%
100.0000 mL | Freq: Once | INTRAVENOUS | Status: AC | PRN
  Administered 2021-10-05: 100 mL via INTRAVENOUS

## 2021-10-10 ENCOUNTER — Ambulatory Visit: Payer: BC Managed Care – PPO | Admitting: Podiatry

## 2021-10-10 DIAGNOSIS — D2371 Other benign neoplasm of skin of right lower limb, including hip: Secondary | ICD-10-CM

## 2021-10-10 DIAGNOSIS — M7672 Peroneal tendinitis, left leg: Secondary | ICD-10-CM

## 2021-10-10 MED ORDER — MELOXICAM 15 MG PO TABS: 15.0000 mg | ORAL_TABLET | Freq: Every day | ORAL | 3 refills | Status: DC

## 2021-10-10 MED ORDER — METHYLPREDNISOLONE 4 MG PO TBPK: ORAL_TABLET | ORAL | 0 refills | Status: DC

## 2021-10-10 NOTE — Progress Notes (Signed)
She presents today chief complaint of a painful corn beneath the fourth and fifth toes of the right foot.  She states that is been bothering her for quite some time now.  She is also noted some pains along the surgical site of the peroneal tendon repair left.  She states that it seems to bother her more at nighttime and less with walking.  Objective: Vital signs are stable she is alert and oriented x3.  Pulses are palpable.  She only has 1 area on palpation of the surgical site left peroneals which is at the superior margin of the incision site.  It is nonpulsatile margins are freely palpable but there is some fluid retention in the soft tissue or the adipose tissue in that area.  She has good plantarflexion and eversion and abduction against resistance without any pain.  She has a painful corn to the lateral aspect of the PIPJ fourth digit of the right foot medial aspect of the DIPJ fifth digit right foot which were debrided today do not demonstrate any ulcerative lesions.    Assessment: It appears that we got some peroneal tendinitis left.  And a painful corn fifth digit right foot.  Plan: Discussed etiology pathology and surgical therapies at this point I went ahead and debrided the reactive hyperkeratotic lesion.  Offered her an injection dexamethasone which she declined.  However she will take a Medrol Dosepak to be followed by meloxicam.  I told her if it become too painful she is to go back in her cam walker and I will follow-up with her in 4 to 6 weeks.

## 2021-11-22 ENCOUNTER — Other Ambulatory Visit: Payer: Self-pay | Admitting: Family Medicine

## 2021-11-22 DIAGNOSIS — M7989 Other specified soft tissue disorders: Secondary | ICD-10-CM

## 2021-11-23 ENCOUNTER — Ambulatory Visit
Admission: RE | Admit: 2021-11-23 | Discharge: 2021-11-23 | Disposition: A | Discharge status: Home / Self Care | Payer: BC Managed Care – PPO | Source: Ambulatory Visit | Attending: Family Medicine | Admitting: Family Medicine

## 2021-11-23 ENCOUNTER — Ambulatory Visit: Payer: BC Managed Care – PPO | Admitting: Podiatry

## 2021-11-23 DIAGNOSIS — M7989 Other specified soft tissue disorders: Secondary | ICD-10-CM

## 2022-07-01 ENCOUNTER — Encounter (HOSPITAL_BASED_OUTPATIENT_CLINIC_OR_DEPARTMENT_OTHER): Payer: Self-pay | Admitting: Emergency Medicine

## 2022-07-01 ENCOUNTER — Emergency Department (HOSPITAL_BASED_OUTPATIENT_CLINIC_OR_DEPARTMENT_OTHER)
Admission: EM | Admit: 2022-07-01 | Discharge: 2022-07-01 | Disposition: A | Discharge status: Home / Self Care | Payer: BC Managed Care – PPO | Attending: Emergency Medicine | Admitting: Emergency Medicine

## 2022-07-01 ENCOUNTER — Other Ambulatory Visit: Payer: Self-pay

## 2022-07-01 DIAGNOSIS — W57XXXA Bitten or stung by nonvenomous insect and other nonvenomous arthropods, initial encounter: Secondary | ICD-10-CM | POA: Insufficient documentation

## 2022-07-01 DIAGNOSIS — S59912A Unspecified injury of left forearm, initial encounter: Secondary | ICD-10-CM | POA: Diagnosis present

## 2022-07-01 DIAGNOSIS — S50862A Insect bite (nonvenomous) of left forearm, initial encounter: Secondary | ICD-10-CM | POA: Diagnosis not present

## 2022-07-01 MED ORDER — DOXYCYCLINE MONOHYDRATE 100 MG PO CAPS: 100.0000 mg | ORAL_CAPSULE | Freq: Two times a day (BID) | ORAL | 0 refills | Status: DC

## 2022-07-01 NOTE — Discharge Instructions (Signed)
Get help right away if: You have a rash. You have muscle or joint pain. You feel unusually tired or weak. You have neck pain or a headache. You develop symptoms of an anaphylactic reaction. These may include: Swelling of the eyes, lips, face, mouth, tongue, or throat. Flushed skin or hives. Wheezing. Difficulty breathing, speaking, or swallowing. Dizziness, light-headedness, or fainting. Abdominal pain, cramping, vomiting, or diarrhea. These symptoms may be an emergency. Get help right away. Call 911. Do not wait to see if the symptoms will go away. Do not drive yourself to the hospital.

## 2022-07-01 NOTE — ED Notes (Signed)
Discharge instructions reviewed with patient. Patient verbalizes understanding, no further questions at this time. Medications/prescriptions and follow up information provided. No acute distress noted at time of departure.  

## 2022-07-01 NOTE — ED Triage Notes (Signed)
Pt thinks she has an insect bite to LT forearm since yesterday; area w/ redness and swelling noted

## 2022-07-01 NOTE — ED Provider Notes (Signed)
[place] [facility] Provider Note   CSN: [id number] Arrival date & time: 07/01/22  1707     History  Chief Complaint  Patient presents with   Insect Bite    Brandy Harris is a 62 y.o. female who presents emergency department with chief complaint of spider bite.  Patient states that she was cleaning out some old boxes and feels like she got bitten.  This was 2 days ago.  She noticed an area of swelling redness and tenderness the next morning with a central pustule.  She squeezed it and a little bit of white discharge came out.  Since that time and is stayed painful and swollen.  She has no other complaints, no fevers or chills.  HPI     Home Medications Prior to Admission medications   Medication Sig Start Date End Date Taking? Authorizing Provider  azelastine (ASTELIN) 137 MCG/SPRAY nasal spray Place 1 spray into the nose 2 (two) times daily. Use in each nostril as directed    [provider]  diazepam (VALIUM) 5 MG tablet Take 2.5 mg by mouth daily as needed. 04/06/19   [provider]  levothyroxine (SYNTHROID, LEVOTHROID) 137 MCG tablet Take 137 mcg by mouth daily before breakfast.    [provider]  meloxicam (MOBIC) 15 MG tablet Take 1 tablet (15 mg total) by mouth daily. 10/10/21   Hyatt, Max T, DPM  methylPREDNISolone (MEDROL DOSEPAK) 4 MG TBPK tablet 6 day dose pack - take as directed 10/10/21   Hyatt, Max T, DPM  mometasone (NASONEX) 50 MCG/ACT nasal spray Place 2 sprays into the nose daily.    [provider]  triamterene-hydrochlorothiazide (MAXZIDE) 75-50 MG tablet Take 1 tablet by mouth every morning. 12/17/15   [provider]  Vitamin D, Ergocalciferol, (DRISDOL) 50000 units CAPS capsule Take by mouth.    [provider]      Allergies    Cephalexin, Lisinopril, and Sulfa antibiotics    Review of Systems   Review of Systems  Physical Exam Updated Vital Signs BP 132/87 (BP  Location: Right Arm)   Pulse (!) 115   Temp 98.4 F (36.9 C) (Oral)   Resp 18   Ht 5\' 7"  (1.702 m)   Wt 89.8 kg   LMP 08/31/2018   SpO2 99%   BMI 31.01 kg/m  Physical Exam Physical Exam  Nursing note and vitals reviewed. Constitutional: She is oriented to person, place, and time. She appears well-developed and well-nourished. No distress.  HENT:  Head: Normocephalic and atraumatic.  Eyes: Conjunctivae normal and EOM are normal. Pupils are equal, round, and reactive to light. No scleral icterus.  Neck: Normal range of motion.  Cardiovascular: Normal rate, regular rhythm and normal heart sounds.  Exam reveals no gallop and no friction rub.   No murmur heard. Pulmonary/Chest: Effort normal and breath sounds normal. No respiratory distress.  Abdominal: Soft. Bowel sounds are normal. She exhibits no distension and no mass. There is no tenderness. There is no guarding.  Neurological: She is alert and oriented to person, place, and time.  Skin: Skin is warm and dry. She is not diaphoretic.  There is a 4 cm tender area of erythema and induration on the left forearm with central scab of about 2 mm.  No fluctuance noted.  ED Results / Procedures / Treatments   Labs (all labs ordered are listed, but only abnormal results are displayed) Labs Reviewed - No data to display  EKG  None  Radiology No results found.  Procedures Procedures    Medications Ordered in ED Medications - No data to display  ED Course/ Medical Decision Making/ A&P                             Medical Decision Making Risk Prescription drug management.   Patient here with area of swelling and tenderness may be early abscess will treat with doxycycline, no fevers or chills, hemodynamically stable will discharge with medications.  Discussed outpatient follow-up and return precautions.  No signs of cellulitis        Final Clinical Impression(s) / ED Diagnoses Final diagnoses:  None    Rx / DC  Orders ED Discharge Orders     None         Arthor Captain, PA-C 07/01/22 1944    Tegeler, Canary Brim, MD 07/01/22 2026

## 2022-09-05 ENCOUNTER — Other Ambulatory Visit (HOSPITAL_BASED_OUTPATIENT_CLINIC_OR_DEPARTMENT_OTHER): Payer: Self-pay | Admitting: Internal Medicine

## 2022-09-05 DIAGNOSIS — E785 Hyperlipidemia, unspecified: Secondary | ICD-10-CM

## 2022-09-10 ENCOUNTER — Ambulatory Visit (HOSPITAL_BASED_OUTPATIENT_CLINIC_OR_DEPARTMENT_OTHER)
Admission: RE | Admit: 2022-09-10 | Discharge: 2022-09-10 | Disposition: A | Discharge status: Home / Self Care | Payer: BC Managed Care – PPO | Source: Ambulatory Visit | Attending: Internal Medicine | Admitting: Internal Medicine

## 2022-09-10 DIAGNOSIS — E785 Hyperlipidemia, unspecified: Secondary | ICD-10-CM | POA: Insufficient documentation

## 2022-11-22 NOTE — Progress Notes (Signed)
Cardiology Office Note:   Date:  11/30/2022  ID:  Brandy Harris, DOB 1960/10/29, MRN [id number] PCP:  Laurann Montana, MD  [facility] Providers Cardiologist:  Alverda Skeans, MD Referring MD: Laurann Montana, MD   Chief Complaint/Reason for Referral:  Dyspnea ASSESSMENT:    1. Coronary artery calcification seen on CAT scan   2. Prediabetes   3. Hyperlipidemia LDL goal <70   4. Primary hypertension   5. BMI 33.0-33.9,adult   6. CKD (chronic kidney disease) stage 2, GFR 60-89 ml/min   7. Nonspecific abnormal electrocardiogram (ECG) (EKG)     PLAN:   In order of problems listed above: Coronary artery calcification: Start aspirin 81 mg and continue statin. Prediabetes:  Start ASA 81mg , continue statin; if develops a diagnosis of diabetes will consider losartan and SGLT2 inhibitor.   Hyperlipidemia:  Increase atorvastatin to 20mg .  Check lipid panel, LFTs, Lp(a) in 2 months.   Hypertension: Blood pressure is well-controlled on her current regimen. Elevated BMI:  Not on Wegovy due to expense.  Continue diet and exercise modification. CKD stage II: Continue current therapy; monitor Abnormal EKG: Due to inferior Q waves we will obtain an echocardiogram to evaluate further.             Dispo:  Return in about 1 year (around 11/30/2023).      Medication Adjustments/Labs and Tests Ordered: Current medicines are reviewed at length with the patient today.  Concerns regarding medicines are outlined above.  The following changes have been made:     Labs/tests ordered: Orders Placed This Encounter  Procedures   Hepatic function panel   Lipid panel   Lipoprotein A (LPA)   EKG 12-Lead   ECHOCARDIOGRAM COMPLETE    Medication Changes: Meds ordered this encounter  Medications   aspirin EC 81 MG tablet    Sig: Take 1 tablet (81 mg total) by mouth daily. Swallow whole.   atorvastatin (LIPITOR) 20 MG tablet    Sig: Take 1 tablet (20 mg total) by mouth daily.    Dispense:  90  tablet    Refill:  3    Current medicines are reviewed at length with the patient today.  The patient does not have concerns regarding medicines.  History of Present Illness:   FOCUSED PROBLEM LIST:   Coronary artery calcification on calcium score CT 2024 Hyperlipidemia Hypertension Angioedema 2/2 ACEi BMI 33 Pre-DM on metformin Hypothyroidism CKD stage II Right adrenal mass on CT A/P 2023 Mnire's disease  The patient is a 62 y.o. female with the indicated medical history here for recommendations regarding dyspnea.  The patient was seen by her PCP recently and due to her dyspnea and family history of coronary artery disease, she was referred for a calcium score CT scan which demonstrated LAD calcification.   The patient tells me that a while ago she would develop some shortness of breath with walking.  She found that she just side a little bit more.  This resolved on its own.  She walks on a regular basis without any limitations now.  She denies any exertional angina.  She has had no palpitations, presyncope, syncope, or peripheral edema.  She does not lay flat in bed due to a history of Mnire's disease.  She has not required any emergency room visits or hospitalizations.  She tells me she feels very well and is without complaints today.       Current Medications: Current Meds  Medication Sig   acetaminophen (TYLENOL) 325 MG tablet  Take 325 mg by mouth every 6 (six) hours as needed for moderate pain. Pt takes 2 tablet as needed.   aspirin EC 81 MG tablet Take 1 tablet (81 mg total) by mouth daily. Swallow whole.   atorvastatin (LIPITOR) 20 MG tablet Take 1 tablet (20 mg total) by mouth daily.   azelastine (ASTELIN) 137 MCG/SPRAY nasal spray Place 1 spray into the nose 2 (two) times daily. Use in each nostril as directed   cyanocobalamin (VITAMIN B12) 1000 MCG tablet Take 1,000 mcg by mouth daily. Pt takes 1 tablet a day.   diazepam (VALIUM) 5 MG tablet Take 0.5 mg by mouth every  6 (six) hours as needed for anxiety. Pt takes 1/2 tablet as needed   levothyroxine (SYNTHROID, LEVOTHROID) 137 MCG tablet Take 137 mcg by mouth daily before breakfast.   loratadine-pseudoephedrine (CLARITIN-D 12-HOUR) 5-120 MG tablet Take 1 tablet by mouth as needed for allergies.   metFORMIN (GLUCOPHAGE-XR) 500 MG 24 hr tablet Take 500 mg by mouth daily with breakfast. Pt takes 2 tablet, 1000 mg once a day.   mometasone (NASONEX) 50 MCG/ACT nasal spray Place 2 sprays into the nose daily.   topiramate (TOPAMAX) 50 MG tablet Take 50 mg by mouth daily. Pt takes 1 tablet once a day.   triamterene-hydrochlorothiazide (MAXZIDE) 75-50 MG tablet Take 1 tablet by mouth every morning.   Vitamin D, Ergocalciferol, (DRISDOL) 50000 units CAPS capsule Take by mouth.   [DISCONTINUED] atorvastatin (LIPITOR) 10 MG tablet Take 10 mg by mouth daily. Pt takes 1 tablet daily.   [DISCONTINUED] doxycycline (MONODOX) 100 MG capsule Take 1 capsule (100 mg total) by mouth 2 (two) times daily.   [DISCONTINUED] meloxicam (MOBIC) 15 MG tablet Take 1 tablet (15 mg total) by mouth daily.   [DISCONTINUED] methylPREDNISolone (MEDROL DOSEPAK) 4 MG TBPK tablet 6 day dose pack - take as directed   [DISCONTINUED] phentermine (ADIPEX-P) 37.5 MG tablet Take 0.5 mg by mouth daily before breakfast. Pt takes 1/2 tablet daily.   [DISCONTINUED] Semaglutide-Weight Management (WEGOVY) 0.25 MG/0.5ML SOAJ Inject 0.25 mg into the skin once a week.     Allergies:    Cephalexin, Lisinopril, and Sulfa antibiotics   Social History:   Social History   Tobacco Use   Smoking status: Never   Smokeless tobacco: Never  Vaping Use   Vaping status: Never Used  Substance Use Topics   Alcohol use: Yes    Comment: occasionally   Drug use: No     Family Hx: Family History  Problem Relation Age of Onset   Heart disease Father      Review of Systems:   Please see the history of present illness.    All other systems reviewed and are  negative.     EKGs/Labs/Other Test Reviewed:   EKG:  EKG performed today that I personally reviewed demonstrates normal sinus rhythm and inferior infarction pattern  EKG Interpretation Date/Time:  Friday November 30 2022 08:38:47 EDT Ventricular Rate:  96 PR Interval:  172 QRS Duration:  80 QT Interval:  358 QTC Calculation: 452 R Axis:   2  Text Interpretation: Normal sinus rhythm Low voltage QRS When compared with ECG of 07-Nov-2011 21:58, No significant change was found Confirmed by Alverda Skeans ([id number]) on 11/30/2022 9:02:46 AM        Prior CV studies reviewed: Cardiac Studies & Procedures          CT SCANS  CT CARDIAC SCORING (SELF PAY ONLY) 09/10/2022  Addendum 09/16/2022 11:57 AM ADDENDUM  REPORT: 09/16/2022 11:54  EXAM: OVER-READ INTERPRETATION  CT CHEST  The following report is an over-read performed by radiologist Dr. Alcide Clever of [facility] on 09/16/2022. This over-read does not include interpretation of cardiac or coronary anatomy or pathology. The coronary calcium score interpretation by the cardiologist is attached.  COMPARISON:  None.  FINDINGS: Cardiovascular: There are no significant extracardiac vascular findings.  Mediastinum/Nodes: There are no enlarged lymph nodes within the visualized mediastinum.  Lungs/Pleura: There is no pleural effusion. Minimal lingular scarring is noted.  Upper abdomen: No significant findings in the visualized upper abdomen.  Musculoskeletal/Chest wall: No chest wall mass or suspicious osseous findings within the visualized chest.  IMPRESSION: No significant extracardiac findings within the visualized chest.   Electronically Signed By: Alcide Clever M.D. On: 09/16/2022 11:54  Narrative CLINICAL DATA:  Cardiovascular Disease Risk stratification  EXAM: Coronary Calcium Score  TECHNIQUE: A gated, non-contrast computed tomography scan of the heart was performed using 3mm slice thickness.  Axial images were analyzed on a dedicated workstation. Calcium scoring of the coronary arteries was performed using the Agatston method.  FINDINGS: Coronary arteries: Normal origins.  Coronary Calcium Score:  Left main: 0  Left anterior descending artery: 9  Left circumflex artery: 0  Right coronary artery: 0  Total: 9  Percentile: 66th  Pericardium: Normal.  Aorta: Normal caliber of ascending aorta. Scattered aortic atherosclerosis noted.  Non-cardiac: See separate report from [facility].  IMPRESSION: Coronary calcium score of 9. This was 66th percentile for age-, race-, and sex-matched controls. Scattered aortic atherosclerosis noted.  RECOMMENDATIONS: Coronary artery calcium (CAC) score is a strong predictor of incident coronary heart disease (CHD) and provides predictive information beyond traditional risk factors. CAC scoring is reasonable to use in the decision to withhold, postpone, or initiate statin therapy in intermediate-risk or selected borderline-risk asymptomatic adults (age 26-75 years and LDL-C >=70 to <190 mg/dL) who do not have diabetes or established atherosclerotic cardiovascular disease (ASCVD).* In intermediate-risk (10-year ASCVD risk >=7.5% to <20%) adults or selected borderline-risk (10-year ASCVD risk >=5% to <7.5%) adults in whom a CAC score is measured for the purpose of making a treatment decision the following recommendations have been made:  If CAC=0, it is reasonable to withhold statin therapy and reassess in 5 to 10 years, as long as higher risk conditions are absent (diabetes mellitus, family history of premature CHD in first degree relatives (males <55 years; females <65 years), cigarette smoking, or LDL >=190 mg/dL).  If CAC is 1 to 99, it is reasonable to initiate statin therapy for patients >=91 years of age.  If CAC is >=100 or >=75th percentile, it is reasonable to initiate statin therapy at any  age.  Cardiology referral should be considered for patients with CAC scores >=400 or >=75th percentile.  *2018 AHA/ACC/AACVPR/AAPA/ABC/ACPM/ADA/AGS/APhA/ASPC/NLA/PCNA Guideline on the Management of Blood Cholesterol: A Report of the American College of Cardiology/American Heart Association Task Force on Clinical Practice Guidelines. J Am Coll Cardiol. 2019;73(24):3168-3209.  Jodelle Red, MD  Electronically Signed: By: Jodelle Red M.D. On: 09/12/2022 08:15          Recent Labs: No results found for requested labs within last 365 days.   Lipid Panel No results found for: "CHOL", "TRIG", "HDL", "CHOLHDL", "VLDL", "LDLCALC", "LDLDIRECT"  Risk Assessment/Calculations:          Physical Exam:   VS:  BP 110/80   Pulse 96   Ht 5\' 7"  (1.702 m)   Wt 193 lb 6.4 oz (87.7  kg)   LMP 08/31/2018   SpO2 99%   BMI 30.29 kg/m        Wt Readings from Last 3 Encounters:  11/30/22 193 lb 6.4 oz (87.7 kg)  07/01/22 198 lb (89.8 kg)  10/16/16 238 lb (108 kg)      GENERAL:  No apparent distress, AOx3 HEENT:  No carotid bruits, +2 carotid impulses, no scleral icterus CAR: RRR no murmurs, gallops, rubs, or thrills RES:  Clear to auscultation bilaterally ABD:  Soft, nontender, nondistended, positive bowel sounds x 4 VASC:  +2 radial pulses, +2 carotid pulses NEURO:  CN 2-12 grossly intact; motor and sensory grossly intact PSYCH:  No active depression or anxiety EXT:  No edema, ecchymosis, or cyanosis  Signed, Orbie Pyo, MD  11/30/2022 9:27 AM    [facility] [address] [place], [place], [place]  [address] Phone: [phone]; Fax: [phone]   Note:  This document was prepared using Dragon voice recognition software and may include unintentional dictation errors.

## 2022-11-30 ENCOUNTER — Encounter: Payer: Self-pay | Admitting: Internal Medicine

## 2022-11-30 ENCOUNTER — Ambulatory Visit: Payer: BC Managed Care – PPO | Attending: Internal Medicine | Admitting: Internal Medicine

## 2022-11-30 VITALS — BP 110/80 | HR 96 | Ht 67.0 in | Wt 193.4 lb

## 2022-11-30 DIAGNOSIS — I2510 Atherosclerotic heart disease of native coronary artery without angina pectoris: Secondary | ICD-10-CM | POA: Diagnosis not present

## 2022-11-30 DIAGNOSIS — E119 Type 2 diabetes mellitus without complications: Secondary | ICD-10-CM

## 2022-11-30 DIAGNOSIS — N182 Chronic kidney disease, stage 2 (mild): Secondary | ICD-10-CM

## 2022-11-30 DIAGNOSIS — E785 Hyperlipidemia, unspecified: Secondary | ICD-10-CM | POA: Diagnosis not present

## 2022-11-30 DIAGNOSIS — I10 Essential (primary) hypertension: Secondary | ICD-10-CM | POA: Diagnosis not present

## 2022-11-30 DIAGNOSIS — R7303 Prediabetes: Secondary | ICD-10-CM

## 2022-11-30 DIAGNOSIS — R9431 Abnormal electrocardiogram [ECG] [EKG]: Secondary | ICD-10-CM

## 2022-11-30 DIAGNOSIS — Z6833 Body mass index (BMI) 33.0-33.9, adult: Secondary | ICD-10-CM

## 2022-11-30 DIAGNOSIS — E1159 Type 2 diabetes mellitus with other circulatory complications: Secondary | ICD-10-CM

## 2022-11-30 DIAGNOSIS — R0609 Other forms of dyspnea: Secondary | ICD-10-CM

## 2022-11-30 DIAGNOSIS — E1169 Type 2 diabetes mellitus with other specified complication: Secondary | ICD-10-CM

## 2022-11-30 MED ORDER — ATORVASTATIN CALCIUM 20 MG PO TABS: 20.0000 mg | ORAL_TABLET | Freq: Every day | ORAL | 3 refills | Status: AC

## 2022-11-30 MED ORDER — ASPIRIN 81 MG PO TBEC: 81.0000 mg | DELAYED_RELEASE_TABLET | Freq: Every day | ORAL | Status: AC

## 2022-11-30 NOTE — Patient Instructions (Signed)
Medication Instructions:  Your physician has recommended you make the following change in your medication:  1.) start aspirin 81 mg - take one tablet daily 2.) increase atorvastatin to 20 mg - one tablet daily  *If you need a refill on your cardiac medications before your next appointment, please call your pharmacy*   Lab Work: Please return in 8 weeks for blood work (lipids, liver, Lp(a))  If you have labs (blood work) drawn today and your tests are completely normal, you will receive your results only by: MyChart Message (if you have MyChart) OR A paper copy in the mail If you have any lab test that is abnormal or we need to change your treatment, we will call you to review the results.   Testing/Procedures: Your physician has requested that you have an echocardiogram. Echocardiography is a painless test that uses sound waves to create images of your heart. It provides your doctor with information about the size and shape of your heart and how well your heart's chambers and valves are working. This procedure takes approximately one hour. There are no restrictions for this procedure. Please do NOT wear cologne, perfume, aftershave, or lotions (deodorant is allowed). Please arrive 15 minutes prior to your appointment time.   Follow-Up: At [facility], you and your health needs are our priority.  As part of our continuing mission to provide you with exceptional heart care, we have created designated Provider Care Teams.  These Care Teams include your primary Cardiologist (physician) and Advanced Practice Providers (APPs -  Physician Assistants and Nurse Practitioners) who all work together to provide you with the care you need, when you need it.   Your next appointment:   12 month(s)  Provider:   Ronie Spies, PA-C, Robin Searing, NP, Jacolyn Reedy, PA-C, Eligha Bridegroom, NP, Tereso Newcomer, PA-C, or Perlie Gold, PA-C       Other Instructions

## 2022-12-18 ENCOUNTER — Other Ambulatory Visit: Payer: Self-pay | Admitting: Family Medicine

## 2022-12-18 DIAGNOSIS — Z1231 Encounter for screening mammogram for malignant neoplasm of breast: Secondary | ICD-10-CM

## 2022-12-20 ENCOUNTER — Ambulatory Visit (HOSPITAL_COMMUNITY): Payer: BC Managed Care – PPO | Attending: Cardiology

## 2022-12-20 DIAGNOSIS — R9431 Abnormal electrocardiogram [ECG] [EKG]: Secondary | ICD-10-CM | POA: Insufficient documentation

## 2022-12-20 LAB — ECHOCARDIOGRAM COMPLETE
Area-P 1/2: 16.86 cm2
P 1/2 time: 281 msec
S' Lateral: 2.5 cm

## 2022-12-26 ENCOUNTER — Ambulatory Visit
Admission: RE | Admit: 2022-12-26 | Discharge: 2022-12-26 | Disposition: A | Discharge status: Home / Self Care | Payer: BC Managed Care – PPO | Source: Ambulatory Visit | Attending: Family Medicine | Admitting: Family Medicine

## 2022-12-26 DIAGNOSIS — Z1231 Encounter for screening mammogram for malignant neoplasm of breast: Secondary | ICD-10-CM

## 2023-01-30 ENCOUNTER — Other Ambulatory Visit: Payer: Self-pay | Admitting: *Deleted

## 2023-01-30 DIAGNOSIS — I2510 Atherosclerotic heart disease of native coronary artery without angina pectoris: Secondary | ICD-10-CM

## 2023-01-30 DIAGNOSIS — E785 Hyperlipidemia, unspecified: Secondary | ICD-10-CM

## 2023-01-31 ENCOUNTER — Ambulatory Visit: Payer: BC Managed Care – PPO | Attending: Internal Medicine

## 2023-02-01 LAB — LIPID PANEL
Chol/HDL Ratio: 2.2 ratio (ref 0.0–4.4)
Cholesterol, Total: 150 mg/dL (ref 100–199)
HDL: 68 mg/dL (ref >39)
LDL Chol Calc (NIH): 64 mg/dL (ref 0–99)
Triglycerides: 102 mg/dL (ref 0–149)
VLDL Cholesterol Cal: 18 mg/dL (ref 5–40)

## 2023-02-01 LAB — HEPATIC FUNCTION PANEL
ALT: 20 [IU]/L (ref 0–32)
AST: 18 [IU]/L (ref 0–40)
Albumin: 4.5 g/dL (ref 3.9–4.9)
Alkaline Phosphatase: 118 [IU]/L (ref 44–121)
Bilirubin Total: 0.5 mg/dL (ref 0.0–1.2)
Bilirubin, Direct: 0.19 mg/dL (ref 0.00–0.40)
Total Protein: 7 g/dL (ref 6.0–8.5)

## 2023-02-01 LAB — LIPOPROTEIN A (LPA): Lipoprotein (a): 31.5 nmol/L (ref <75.0)

## 2023-05-02 ENCOUNTER — Ambulatory Visit: Payer: 59 | Admitting: Podiatry

## 2023-05-02 ENCOUNTER — Ambulatory Visit: Payer: 59

## 2023-05-02 DIAGNOSIS — M7672 Peroneal tendinitis, left leg: Secondary | ICD-10-CM

## 2023-05-02 DIAGNOSIS — M7671 Peroneal tendinitis, right leg: Secondary | ICD-10-CM | POA: Diagnosis not present

## 2023-05-02 DIAGNOSIS — M7661 Achilles tendinitis, right leg: Secondary | ICD-10-CM | POA: Diagnosis not present

## 2023-05-02 MED ORDER — DICLOFENAC SODIUM 75 MG PO TBEC: 75.0000 mg | DELAYED_RELEASE_TABLET | Freq: Two times a day (BID) | ORAL | 2 refills | Status: AC

## 2023-05-02 MED ORDER — TRIAMCINOLONE ACETONIDE 10 MG/ML IJ SUSP
10.0000 mg | Freq: Once | INTRAMUSCULAR | Status: AC
Start: 2023-05-02 — End: 2023-05-02
  Administered 2023-05-02: 10 mg via INTRA_ARTICULAR

## 2023-05-02 NOTE — Patient Instructions (Signed)

## 2023-05-03 NOTE — Progress Notes (Signed)
 Subjective:   Patient ID: Brandy Harris, female   DOB: 63 y.o.   MRN: [id number]   HPI Patient presents stating she has a lot of pain in the back of her right heel and was dispensed a boot which is helping some but it still very inflamed.  Patient does not remember injury   ROS      Objective:  Physical Exam  Neuro vascular status intact with exquisite discomfort of the right posterior heel on the lateral side Achilles with no central or medial involvement     Assessment:  Acute Achilles tendinitis right lateral side painful     Plan:  H&P reviewed and I discussed this condition and recommended careful injection explaining risk of doing this and patient wants procedure understanding risk of rupture.  Careful sterile prep injected just the lateral side 3 mg Dexasone Kenalog  5 mg Xylocaine  advised on reduced activity wearing the boot at all times for the next couple weeks.  Reappoint 3 to 4 weeks to recheck all questions answered  X-rays indicate small spur no indication of stress fracture arthritis

## 2023-08-29 ENCOUNTER — Other Ambulatory Visit: Payer: Self-pay | Admitting: Family Medicine

## 2023-08-29 DIAGNOSIS — N644 Mastodynia: Secondary | ICD-10-CM

## 2023-09-13 ENCOUNTER — Ambulatory Visit
Admission: RE | Admit: 2023-09-13 | Discharge: 2023-09-13 | Disposition: A | Discharge status: Home / Self Care | Source: Ambulatory Visit | Attending: Family Medicine | Admitting: Family Medicine

## 2023-09-13 DIAGNOSIS — N644 Mastodynia: Secondary | ICD-10-CM

## 2023-09-24 ENCOUNTER — Other Ambulatory Visit: Payer: Self-pay

## 2023-11-28 ENCOUNTER — Encounter: Payer: Self-pay | Admitting: Internal Medicine

## 2023-11-29 ENCOUNTER — Emergency Department (HOSPITAL_COMMUNITY)
Admission: EM | Admit: 2023-11-29 | Discharge: 2023-11-30 | Disposition: A | Discharge status: Home / Self Care | Payer: Worker's Compensation | Attending: Emergency Medicine | Admitting: Emergency Medicine

## 2023-11-29 ENCOUNTER — Other Ambulatory Visit: Payer: Self-pay

## 2023-11-29 ENCOUNTER — Encounter (HOSPITAL_COMMUNITY): Payer: Self-pay

## 2023-11-29 ENCOUNTER — Emergency Department (HOSPITAL_COMMUNITY): Payer: Worker's Compensation

## 2023-11-29 DIAGNOSIS — I10 Essential (primary) hypertension: Secondary | ICD-10-CM | POA: Diagnosis not present

## 2023-11-29 DIAGNOSIS — S0003XA Contusion of scalp, initial encounter: Secondary | ICD-10-CM | POA: Insufficient documentation

## 2023-11-29 DIAGNOSIS — S0990XA Unspecified injury of head, initial encounter: Secondary | ICD-10-CM | POA: Diagnosis present

## 2023-11-29 DIAGNOSIS — W2104XA Struck by golf ball, initial encounter: Secondary | ICD-10-CM | POA: Diagnosis not present

## 2023-11-29 DIAGNOSIS — S0012XA Contusion of left eyelid and periocular area, initial encounter: Secondary | ICD-10-CM | POA: Insufficient documentation

## 2023-11-29 DIAGNOSIS — Y9353 Activity, golf: Secondary | ICD-10-CM | POA: Diagnosis not present

## 2023-11-29 DIAGNOSIS — Z7982 Long term (current) use of aspirin: Secondary | ICD-10-CM | POA: Diagnosis not present

## 2023-11-29 NOTE — ED Notes (Signed)
 Per PA Warren okay to go to lobby

## 2023-11-29 NOTE — ED Provider Notes (Signed)
 [place] EMERGENCY DEPARTMENT AT [facility] Provider Note   CSN: [id number] Arrival date & time: 11/29/23  1445     Patient presents with: Head Injury   Brandy Harris is a 63 y.o. female.   63 year old female presents to the emergency department for evaluation of left forehead swelling.  She reports being struck with a golf ball at 1330 today.  She had no loss of consciousness at the time of injury.  Denies headache, vision changes, nausea, vomiting, new or worsening tinnitus.  She is not on chronic anticoagulation.  No medications taken prior to arrival.  The history is provided by the patient. No language interpreter was used.  Head Injury      Prior to Admission medications   Medication Sig Start Date End Date Taking? Authorizing Provider  acetaminophen  (TYLENOL ) 325 MG tablet Take 325 mg by mouth every 6 (six) hours as needed for moderate pain. Pt takes 2 tablet as needed.    [provider]  aspirin  EC 81 MG tablet Take 1 tablet (81 mg total) by mouth daily. Swallow whole. 11/30/22   Thukkani, Arun K, MD  atorvastatin  (LIPITOR) 20 MG tablet Take 1 tablet (20 mg total) by mouth daily. 11/30/22 02/28/23  Thukkani, Arun K, MD  azelastine  (ASTELIN ) 137 MCG/SPRAY nasal spray Place 1 spray into the nose 2 (two) times daily. Use in each nostril as directed    [provider]  cyanocobalamin (VITAMIN B12) 1000 MCG tablet Take 1,000 mcg by mouth daily. Pt takes 1 tablet a day.    [provider]  diazepam (VALIUM) 5 MG tablet Take 0.5 mg by mouth every 6 (six) hours as needed for anxiety. Pt takes 1/2 tablet as needed    [provider]  diclofenac  (VOLTAREN ) 75 MG EC tablet Take 1 tablet (75 mg total) by mouth 2 (two) times daily. 05/02/23   Magdalen Pasco RAMAN, DPM  levothyroxine  (SYNTHROID , LEVOTHROID) 137 MCG tablet Take 137 mcg by mouth daily before breakfast.    [provider]  loratadine -pseudoephedrine  (CLARITIN -D 12-HOUR) 5-120 MG  tablet Take 1 tablet by mouth as needed for allergies.    [provider]  metFORMIN (GLUCOPHAGE-XR) 500 MG 24 hr tablet Take 500 mg by mouth daily with breakfast. Pt takes 2 tablet, 1000 mg once a day.    [provider]  mometasone (NASONEX) 50 MCG/ACT nasal spray Place 2 sprays into the nose daily.    [provider]  topiramate (TOPAMAX) 50 MG tablet Take 50 mg by mouth daily. Pt takes 1 tablet once a day.    [provider]  triamterene-hydrochlorothiazide (MAXZIDE) 75-50 MG tablet Take 1 tablet by mouth every morning. 12/17/15   [provider]  Vitamin D, Ergocalciferol, (DRISDOL) 50000 units CAPS capsule Take by mouth.    [provider]    Allergies: Cephalexin , Lisinopril , and Sulfa antibiotics    Review of Systems Ten systems reviewed and are negative for acute change, except as noted in the HPI.    Updated Vital Signs BP (!) 136/94 (BP Location: Left Arm)   Pulse 87   Temp 98.5 F (36.9 C)   Resp 18   Ht 5' 7 (1.702 m)   Wt 86.2 kg   LMP 08/31/2018   SpO2 100%   BMI 29.76 kg/m   Physical Exam Vitals and nursing note reviewed.  Constitutional:      General: She is not in acute distress.    Appearance: She is well-developed. She is  not diaphoretic.     Comments: Nontoxic appearing. Alert and pleasant.  HENT:     Head: Normocephalic.     Comments: Hematoma around the L lateral eyebrow Eyes:     General: No scleral icterus.    Conjunctiva/sclera: Conjunctivae normal.  Pulmonary:     Effort: Pulmonary effort is normal. No respiratory distress.     Comments: Respirations even and unlabored Musculoskeletal:        General: Normal range of motion.     Cervical back: Normal range of motion.  Skin:    General: Skin is warm and dry.     Coloration: Skin is not pale.     Findings: No erythema or rash.  Neurological:     Mental Status: She is alert and oriented to person, place, and time.     Coordination:  Coordination normal.     Comments: GCS 15. No focal deficits appreciated. Moving all extremities spontaneously, symmetrically. Speech clear, goal oriented.  Psychiatric:        Behavior: Behavior normal.     (all labs ordered are listed, but only abnormal results are displayed) Labs Reviewed - No data to display  EKG: None  Radiology: CT Head Wo Contrast Result Date: 11/29/2023 EXAM: CT HEAD WITHOUT CONTRAST 11/29/2023 03:46:20 PM TECHNIQUE: CT of the head was performed without the administration of intravenous contrast. Automated exposure control, iterative reconstruction, and/or weight based adjustment of the mA/kV was utilized to reduce the radiation dose to as low as reasonably achievable. COMPARISON: None available. CLINICAL HISTORY: Head trauma, moderate-severe. 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 No loc, no blood thinners. Hematoma to left forehead. FINDINGS: BRAIN AND VENTRICLES: No acute hemorrhage. No evidence of acute infarct. No hydrocephalus. No extra-axial collection. No mass effect or midline shift. ORBITS: No associated orbital hematoma. SINUSES: No acute abnormality. SOFT TISSUES AND SKULL: Left supraorbital scalp hematoma with underlying left frontal zygomatic sutural diastasis versus nondisplaced fracture. IMPRESSION: 1. Left supraorbital scalp hematoma with underlying left frontal zygomatic sutural diastasis versus nondisplaced fracture. No associated orbital hematoma. 2. No acute intracranial hemorrhage. Electronically signed by: Ryan Chess MD 11/29/2023 03:57 PM EDT RP Workstation: [id number]     Procedures   Medications Ordered in the ED - No data to display  Clinical Course as of 11/29/23 2352  Fri Nov 29, 2023  2348 Spoke with Dr. Colon of Neurosurgery regarding CT findings who reports no need for outpatient follow up; states that PCP f/u as needed is sufficient. [KH]    Clinical Course User Index [KH] Keith Sor, PA-C                                  Medical Decision Making  This patient presents to the ED for concern of head injury, this involves an extensive number of treatment options, and is a complaint that carries with it a high risk of complications and morbidity.  The differential diagnosis includes contusion vs concussion vs ICH vs skull fx   Co morbidities that complicate the patient evaluation  HTN Mnire's disease Thyroid disease   Additional history obtained:  Additional history obtained from friend, at bedside   Imaging Studies ordered:  I ordered imaging studies including CT head  I independently visualized and interpreted imaging which showed left supraorbital scalp hematoma with left frontal zygomatic sutural diastasis versus nondisplaced fracture. No ICH. I agree  with the radiologist interpretation   Cardiac Monitoring:  The patient was maintained on a cardiac monitor.  I personally viewed and interpreted the cardiac monitored which showed an underlying rhythm of: NSR   Medicines ordered and prescription drug management:  I have reviewed the patients home medicines and have made adjustments as needed   Consultations Obtained:  I requested consultation with Dr. Colon of Neurosurgery and discussed imaging findings as well as pertinent plan - they recommend: PCP f/u for recheck PRN. No indication for outpatient neurosurgical evaluation.   Problem List / ED Course:  As above No focal neurologic deficits on exam. No c/o headache. VSS.   Reevaluation:  After the interventions noted above, I reevaluated the patient and found that they have :stayed the same   Social Determinants of Health:  Lives independently    Dispostion:  After consideration of the diagnostic results and the patients response to treatment, I feel that the patent would benefit from PCP f/u PRN. Encouraged OTC NSAIDs vs APAP for residual pain, headache. Return precautions discussed and  provided. Patient discharged in stable condition with no unaddressed concerns.       Final diagnoses:  Scalp hematoma, initial encounter    ED Discharge Orders     None          Keith Sor, DEVONNA 11/29/23 2355    Raford Lenis, MD 12/01/23 [phone]

## 2023-11-29 NOTE — ED Provider Triage Note (Signed)
 Emergency Medicine Provider Triage Evaluation Note  Brandy Harris , a 63 y.o. female  was evaluated in triage.  Pt complains of hit in the left forehead with a golf ball.    No loss of consciousness, nonfocal neuroexam, moving extremities without difficulty, no vision changes, no blood thinner, no seizure, no nausea or vomiting.  Review of Systems  Positive: Head injury Negative: Fever  Physical Exam  BP (!) 143/82 (BP Location: Left Arm)   Pulse (!) 103   Temp 98.1 F (36.7 C)   Resp 16   Ht 5' 7 (1.702 m)   Wt 86.2 kg   LMP 08/31/2018   SpO2 99%   BMI 29.76 kg/m  Gen:   Awake, no distress   Resp:  Normal effort  MSK:   Moves extremities without difficulty  Other: Hematoma to left forehead   Medical Decision Making  Medically screening exam initiated at 3:20 PM.  Appropriate orders placed.  Brandy Harris was informed that the remainder of the evaluation will be completed by another provider, this initial triage assessment does not replace that evaluation, and the importance of remaining in the ED until their evaluation is complete.     Brandy Warren SAILOR, PA-C 11/29/23 1521

## 2023-11-29 NOTE — ED Triage Notes (Signed)
 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 No loc, no blood thinners. Hematoma to left forehead.

## 2023-11-29 NOTE — Discharge Instructions (Addendum)
 Take Tylenol  or ibuprofen  as needed for management of any residual pain or headache.  You may also ice your hematoma to help improve swelling.  Follow-up with your primary care doctor.  Neurosurgical follow up is not necessary.  Return to the emergency department for new or concerning symptoms.

## 2023-11-30 NOTE — ED Notes (Signed)
 Patient A&Ox4 at departure. States understanding of d/c orders. Ambulatory from ED.

## 2024-02-04 ENCOUNTER — Encounter (HOSPITAL_BASED_OUTPATIENT_CLINIC_OR_DEPARTMENT_OTHER): Payer: Self-pay | Admitting: Family Medicine

## 2024-02-05 ENCOUNTER — Other Ambulatory Visit (HOSPITAL_BASED_OUTPATIENT_CLINIC_OR_DEPARTMENT_OTHER): Payer: Self-pay | Admitting: Family Medicine

## 2024-02-05 DIAGNOSIS — E2839 Other primary ovarian failure: Secondary | ICD-10-CM

## 2024-04-20 ENCOUNTER — Other Ambulatory Visit (HOSPITAL_BASED_OUTPATIENT_CLINIC_OR_DEPARTMENT_OTHER)

## 2024-05-04 ENCOUNTER — Ambulatory Visit: Admitting: Physician Assistant

## 2024-05-14 ENCOUNTER — Other Ambulatory Visit (HOSPITAL_BASED_OUTPATIENT_CLINIC_OR_DEPARTMENT_OTHER)
